# Patient Record
Sex: Female | Born: 1995 | Race: White | Hispanic: No | Marital: Married | Smoking: Never smoker
Health system: Southern US, Community
[De-identification: ages and names within clinical notes are randomized; demographics above are authoritative.]

## PROBLEM LIST (undated history)

## (undated) DIAGNOSIS — N200 Calculus of kidney: Secondary | ICD-10-CM

## (undated) DIAGNOSIS — D696 Thrombocytopenia, unspecified: Secondary | ICD-10-CM

## (undated) DIAGNOSIS — O009 Unspecified ectopic pregnancy without intrauterine pregnancy: Secondary | ICD-10-CM

## (undated) DIAGNOSIS — F319 Bipolar disorder, unspecified: Secondary | ICD-10-CM

## (undated) HISTORY — PX: UPPER GASTROINTESTINAL ENDOSCOPY: SHX188

## (undated) HISTORY — PX: TUBAL LIGATION: SHX77

## (undated) HISTORY — PX: LAPAROSCOPIC CHOLECYSTECTOMY: SUR755

## (undated) HISTORY — DX: Unspecified ectopic pregnancy without intrauterine pregnancy: O00.90

## (undated) HISTORY — DX: Calculus of kidney: N20.0

---

## 2019-05-06 ENCOUNTER — Other Ambulatory Visit: Payer: Self-pay

## 2019-05-06 ENCOUNTER — Encounter (HOSPITAL_COMMUNITY): Payer: Self-pay | Admitting: Emergency Medicine

## 2019-05-06 ENCOUNTER — Emergency Department (HOSPITAL_COMMUNITY): Payer: PRIVATE HEALTH INSURANCE

## 2019-05-06 ENCOUNTER — Other Ambulatory Visit: Payer: Self-pay | Admitting: Obstetrics and Gynecology

## 2019-05-06 ENCOUNTER — Observation Stay (HOSPITAL_COMMUNITY)
Admission: EM | Admit: 2019-05-06 | Discharge: 2019-05-07 | Disposition: A | Discharge status: Home / Self Care | Payer: PRIVATE HEALTH INSURANCE | Attending: Family Medicine | Admitting: Family Medicine

## 2019-05-06 ENCOUNTER — Observation Stay (HOSPITAL_COMMUNITY): Payer: PRIVATE HEALTH INSURANCE

## 2019-05-06 DIAGNOSIS — Z3A01 Less than 8 weeks gestation of pregnancy: Secondary | ICD-10-CM | POA: Insufficient documentation

## 2019-05-06 DIAGNOSIS — N8312 Corpus luteum cyst of left ovary: Secondary | ICD-10-CM | POA: Diagnosis not present

## 2019-05-06 DIAGNOSIS — O00101 Right tubal pregnancy without intrauterine pregnancy: Principal | ICD-10-CM | POA: Insufficient documentation

## 2019-05-06 DIAGNOSIS — N888 Other specified noninflammatory disorders of cervix uteri: Secondary | ICD-10-CM | POA: Insufficient documentation

## 2019-05-06 DIAGNOSIS — Z7982 Long term (current) use of aspirin: Secondary | ICD-10-CM | POA: Diagnosis not present

## 2019-05-06 DIAGNOSIS — O26899 Other specified pregnancy related conditions, unspecified trimester: Secondary | ICD-10-CM | POA: Diagnosis present

## 2019-05-06 DIAGNOSIS — R102 Pelvic and perineal pain: Secondary | ICD-10-CM | POA: Diagnosis present

## 2019-05-06 DIAGNOSIS — O3680X0 Pregnancy with inconclusive fetal viability, not applicable or unspecified: Secondary | ICD-10-CM

## 2019-05-06 DIAGNOSIS — D696 Thrombocytopenia, unspecified: Secondary | ICD-10-CM | POA: Insufficient documentation

## 2019-05-06 DIAGNOSIS — Z9049 Acquired absence of other specified parts of digestive tract: Secondary | ICD-10-CM | POA: Diagnosis not present

## 2019-05-06 DIAGNOSIS — R109 Unspecified abdominal pain: Secondary | ICD-10-CM | POA: Diagnosis present

## 2019-05-06 DIAGNOSIS — Z20822 Contact with and (suspected) exposure to covid-19: Secondary | ICD-10-CM | POA: Diagnosis not present

## 2019-05-06 DIAGNOSIS — R110 Nausea: Secondary | ICD-10-CM | POA: Insufficient documentation

## 2019-05-06 DIAGNOSIS — K661 Hemoperitoneum: Secondary | ICD-10-CM | POA: Diagnosis not present

## 2019-05-06 DIAGNOSIS — R9389 Abnormal findings on diagnostic imaging of other specified body structures: Secondary | ICD-10-CM | POA: Diagnosis not present

## 2019-05-06 DIAGNOSIS — O26892 Other specified pregnancy related conditions, second trimester: Secondary | ICD-10-CM | POA: Diagnosis not present

## 2019-05-06 HISTORY — DX: Thrombocytopenia, unspecified: D69.6

## 2019-05-06 LAB — COMPREHENSIVE METABOLIC PANEL
ALT: 16 U/L (ref 0–44)
AST: 16 U/L (ref 15–41)
Albumin: 4.3 g/dL (ref 3.5–5.0)
Alkaline Phosphatase: 45 U/L (ref 38–126)
Anion gap: 10 (ref 5–15)
BUN: 9 mg/dL (ref 6–20)
CO2: 21 mmol/L — ABNORMAL LOW (ref 22–32)
Calcium: 8.6 mg/dL — ABNORMAL LOW (ref 8.9–10.3)
Chloride: 107 mmol/L (ref 98–111)
Creatinine, Ser: 0.58 mg/dL (ref 0.44–1.00)
GFR calc Af Amer: >60 mL/min (ref >60)
GFR calc non Af Amer: >60 mL/min (ref >60)
Glucose, Bld: 94 mg/dL (ref 70–99)
Potassium: 3.6 mmol/L (ref 3.5–5.1)
Sodium: 138 mmol/L (ref 135–145)
Total Bilirubin: 0.5 mg/dL (ref 0.3–1.2)
Total Protein: 7.2 g/dL (ref 6.5–8.1)

## 2019-05-06 LAB — CBC WITH DIFFERENTIAL/PLATELET
Abs Immature Granulocytes: 0.01 10*3/uL (ref 0.00–0.07)
Basophils Absolute: 0.1 10*3/uL (ref 0.0–0.1)
Basophils Relative: 1 %
Eosinophils Absolute: 0.1 10*3/uL (ref 0.0–0.5)
Eosinophils Relative: 1 %
HCT: 40.4 % (ref 36.0–46.0)
Hemoglobin: 13.7 g/dL (ref 12.0–15.0)
Immature Granulocytes: 0 %
Lymphocytes Relative: 28 %
Lymphs Abs: 1.7 10*3/uL (ref 0.7–4.0)
MCH: 30.8 pg (ref 26.0–34.0)
MCHC: 33.9 g/dL (ref 30.0–36.0)
MCV: 90.8 fL (ref 80.0–100.0)
Monocytes Absolute: 0.4 10*3/uL (ref 0.1–1.0)
Monocytes Relative: 6 %
Neutro Abs: 3.8 10*3/uL (ref 1.7–7.7)
Neutrophils Relative %: 64 %
Platelets: 120 10*3/uL — ABNORMAL LOW (ref 150–400)
RBC: 4.45 MIL/uL (ref 3.87–5.11)
RDW: 12.6 % (ref 11.5–15.5)
WBC: 6.1 10*3/uL (ref 4.0–10.5)
nRBC: 0 % (ref 0.0–0.2)

## 2019-05-06 LAB — FIBRINOGEN: Fibrinogen: 309 mg/dL (ref 210–475)

## 2019-05-06 LAB — I-STAT BETA HCG BLOOD, ED (MC, WL, AP ONLY): I-stat hCG, quantitative: >2000 m[IU]/mL — ABNORMAL HIGH (ref <5)

## 2019-05-06 LAB — URINALYSIS, ROUTINE W REFLEX MICROSCOPIC
Bacteria, UA: NONE SEEN
Bilirubin Urine: NEGATIVE
Glucose, UA: NEGATIVE mg/dL
Hgb urine dipstick: NEGATIVE
Ketones, ur: 5 mg/dL — AB
Leukocytes,Ua: NEGATIVE
Nitrite: NEGATIVE
Protein, ur: NEGATIVE mg/dL
Specific Gravity, Urine: 1.008 (ref 1.005–1.030)
pH: 6 (ref 5.0–8.0)

## 2019-05-06 LAB — RESPIRATORY PANEL BY RT PCR (FLU A&B, COVID)
Influenza A by PCR: NEGATIVE
Influenza B by PCR: NEGATIVE
SARS Coronavirus 2 by RT PCR: NEGATIVE

## 2019-05-06 LAB — PROTIME-INR
INR: 1.1 (ref 0.8–1.2)
Prothrombin Time: 13.9 seconds (ref 11.4–15.2)

## 2019-05-06 LAB — HCG, QUANTITATIVE, PREGNANCY: hCG, Beta Chain, Quant, S: 15722 m[IU]/mL — ABNORMAL HIGH (ref <5)

## 2019-05-06 LAB — APTT: aPTT: 30 seconds (ref 24–36)

## 2019-05-06 LAB — LIPASE, BLOOD: Lipase: 29 U/L (ref 11–51)

## 2019-05-06 MED ORDER — ONDANSETRON HCL 4 MG/2ML IJ SOLN
4.0000 mg | Freq: Once | INTRAMUSCULAR | Status: AC
  Administered 2019-05-06: 4 mg via INTRAVENOUS
  Filled 2019-05-06: qty 2

## 2019-05-06 MED ORDER — ZOLPIDEM TARTRATE 5 MG PO TABS
5.0000 mg | ORAL_TABLET | Freq: Every evening | ORAL | Status: DC | PRN
  Administered 2019-05-06: 5 mg via ORAL
  Filled 2019-05-06: qty 1

## 2019-05-06 MED ORDER — SODIUM CHLORIDE 0.9 % IV BOLUS
1000.0000 mL | Freq: Once | INTRAVENOUS | Status: AC
  Administered 2019-05-06: 1000 mL via INTRAVENOUS

## 2019-05-06 MED ORDER — LACTATED RINGERS IV SOLN: INTRAVENOUS | Status: DC

## 2019-05-06 MED ORDER — HYDROMORPHONE HCL 1 MG/ML IJ SOLN
0.5000 mg | INTRAMUSCULAR | Status: AC | PRN
  Administered 2019-05-06 (×2): 0.5 mg via INTRAVENOUS
  Filled 2019-05-06 (×2): qty 1

## 2019-05-06 MED ORDER — MORPHINE SULFATE (PF) 4 MG/ML IV SOLN
4.0000 mg | Freq: Once | INTRAVENOUS | Status: AC
  Administered 2019-05-06: 4 mg via INTRAVENOUS
  Filled 2019-05-06: qty 1

## 2019-05-06 MED ORDER — ONDANSETRON HCL 4 MG PO TABS: 4.0000 mg | ORAL_TABLET | Freq: Four times a day (QID) | ORAL | Status: DC | PRN

## 2019-05-06 MED ORDER — ONDANSETRON HCL 4 MG/2ML IJ SOLN: 4.0000 mg | Freq: Four times a day (QID) | INTRAMUSCULAR | Status: DC | PRN

## 2019-05-06 NOTE — ED Triage Notes (Signed)
Per pt, states lower abdominal pain that started this am-N/D

## 2019-05-06 NOTE — ED Notes (Signed)
[facility] dispatch notified for need of transport.  

## 2019-05-06 NOTE — ED Notes (Signed)
[facility] here for transport to [facility].

## 2019-05-06 NOTE — ED Notes (Signed)
Report attempted to be called to [facility] but was told nurse was still in report and would call back.

## 2019-05-06 NOTE — ED Provider Notes (Signed)
[facility]-EMERGENCY DEPT Provider Note   CSN: [id number] Arrival date & time: 05/06/19  1456     History Chief Complaint  Patient presents with  . Abdominal Pain    Sally Haas is a 24 y.o. female.  HPI      Sally Haas is a 24 y.o. female, with a history of G3P1, presenting to the ED with sudden onset of abdominal pain around 10:40 AM this morning.  Patient was standing stationary at that time.  Pain is right lower quadrant, sharp and stabbing, currently 7/10, radiating toward the suprapubic and periumbilical regions.  She has had a couple episodes of loose stools.  Accompanied by nausea. She states she had some vaginal bleeding last night and has been spotting since then. LMP March 23, 2019.  She notes she had a positive pregnancy test at home. She does not have an assigned OB/GYN. She has had one pregnancy ending in miscarriage and one live birth. Last food was 9:30 AM this morning. Denies fever/chills, vomiting, hematochezia/melena, chest pain, shortness of breath, cough, urinary symptoms, or any other complaints.   History reviewed. No pertinent past medical history.  Patient Active Problem List   Diagnosis Date Noted  . Abdominal pain in pregnancy 05/06/2019  . Pregnancy of unknown anatomic location 05/06/2019    History reviewed. No pertinent surgical history.   OB History   No obstetric history on file.     History reviewed. No pertinent family history.  Social History   Tobacco Use  . Smoking status: Not on file  Substance Use Topics  . Alcohol use: Not on file  . Drug use: Not on file    Home Medications Prior to Admission medications   Medication Sig Start Date End Date Taking? Authorizing Provider  aspirin-acetaminophen-caffeine (EXCEDRIN MIGRAINE) [phone] MG tablet Take 1 tablet by mouth every 6 (six) hours as needed for headache.   Yes [provider]  Multiple Vitamins-Minerals (MULTIVITAMIN GUMMIES  ADULT PO) Take 1 Troche by mouth daily in the afternoon.   Yes [provider]    Allergies    Patient has no known allergies.  Review of Systems   Review of Systems  Constitutional: Negative for chills and fever.  Respiratory: Negative for cough and shortness of breath.   Cardiovascular: Negative for chest pain.  Gastrointestinal: Positive for abdominal pain, diarrhea and nausea. Negative for vomiting.  Genitourinary: Positive for vaginal bleeding. Negative for dysuria and frequency.  Musculoskeletal: Negative for back pain.  Neurological: Negative for dizziness, syncope, weakness and light-headedness.  All other systems reviewed and are negative.   Physical Exam Updated Vital Signs BP 121/74 (BP Location: Right Arm)   Pulse 80   Temp 98.6 F (37 C) (Oral)   Resp 16   Wt 82.6 kg   SpO2 100%   Physical Exam Vitals and nursing note reviewed.  Constitutional:      General: She is in acute distress (Pain).     Appearance: She is well-developed. She is not diaphoretic.  HENT:     Head: Normocephalic and atraumatic.     Mouth/Throat:     Mouth: Mucous membranes are moist.     Pharynx: Oropharynx is clear.  Eyes:     Conjunctiva/sclera: Conjunctivae normal.  Cardiovascular:     Rate and Rhythm: Normal rate and regular rhythm.     Pulses: Normal pulses.          Radial pulses are 2+ on the right side and 2+  on the left side.     Heart sounds: Normal heart sounds.  Pulmonary:     Effort: Pulmonary effort is normal. No respiratory distress.     Breath sounds: Normal breath sounds.  Abdominal:     Palpations: Abdomen is soft.     Tenderness: There is abdominal tenderness. There is no guarding.       Comments: Tenderness is worst in the right lower quadrant.  Musculoskeletal:     Cervical back: Neck supple.     Right lower leg: No edema.     Left lower leg: No edema.  Lymphadenopathy:     Cervical: No cervical adenopathy.  Skin:    General: Skin is warm and  dry.  Neurological:     Mental Status: She is alert.  Psychiatric:        Mood and Affect: Mood and affect normal.        Speech: Speech normal.        Behavior: Behavior normal.     ED Results / Procedures / Treatments   Labs (all labs ordered are listed, but only abnormal results are displayed) Labs Reviewed  COMPREHENSIVE METABOLIC PANEL - Abnormal; Notable for the following components:      Result Value   CO2 21 (*)    Calcium 8.6 (*)    All other components within normal limits  URINALYSIS, ROUTINE W REFLEX MICROSCOPIC - Abnormal; Notable for the following components:   Color, Urine STRAW (*)    Ketones, ur 5 (*)    All other components within normal limits  CBC WITH DIFFERENTIAL/PLATELET - Abnormal; Notable for the following components:   Platelets 120 (*)    All other components within normal limits  HCG, QUANTITATIVE, PREGNANCY - Abnormal; Notable for the following components:   hCG, Beta Chain, Quant, S 15,722 (*)    All other components within normal limits  I-STAT BETA HCG BLOOD, ED ([facility], [facility], [facility] ONLY) - Abnormal; Notable for the following components:   I-stat hCG, quantitative >2,000.0 (*)    All other components within normal limits  RESPIRATORY PANEL BY RT PCR (FLU A&B, COVID)  LIPASE, BLOOD  APTT  CBC  FIBRINOGEN  HCG, QUANTITATIVE, PREGNANCY  PROTIME-INR  TYPE AND SCREEN    EKG None  Radiology US OB LESS THAN 14 WEEKS WITH OB TRANSVAGINAL  Result Date: 05/06/2019 CLINICAL DATA:  Pelvic cramping. Pelvic pain, onset this morning. Gestational age [redacted] weeks 2 days based on last menstrual period of 03/23/2019. EXAM: OBSTETRIC <14 WK [place] AND TRANSVAGINAL OB US TECHNIQUE: Both transabdominal and transvaginal ultrasound examinations were performed for complete evaluation of the gestation as well as the maternal uterus, adnexal regions, and pelvic cul-de-sac. Transvaginal technique was performed to assess early pregnancy. COMPARISON:  None this pregnancy. FINDINGS:  Intrauterine gestational sac: Possible small, however abnormally located in the cervix. Yolk sac:  Not Visualized. Embryo:  Not Visualized. Cardiac Activity: Not Visualized. MSD: 4.2 mm   5 w   1 d Subchorionic hemorrhage:  None visualized. Maternal uterus/adnexae: The uterus is anteverted. The endometrium measures 12-13 mm. Small cystic structure located in the cervix may represent a gestational sac, but is abnormally located. The right ovary appears normal measuring 3.4 x 2.1 x 2.1 cm. Blood flow is noted. Small amount of free fluid in the right adnexa and pelvis appears to be simple. The left ovary measures 3.4 x 2.0 x 3.4 cm and contains a hypoechoic 2.6 cm cyst which may represent a collapsing corpus luteum.  Blood flow is noted. No extra ovarian adnexal mass. IMPRESSION: 1. Possible tiny gestational sac abnormally position in the cervix, but no yolk sac, fetal pole, or cardiac activity demonstrated. Findings are suspicious but not yet definitive for failed pregnancy. No other intrauterine gestational sac or intrauterine pregnancy. Recommend trending of beta HCG. Follow-up ultrasound could be considered in 7-10 days based on beta hCG trending. 2. No adnexal mass or findings suspicious for ectopic pregnancy. 3. Small amount of free fluid in the right adnexa and dependent pelvis is nonspecific. Right ovary is well visualized and appears normal. Probable collapsing corpus luteal cyst in the left ovary. Electronically Signed   By: Keith Rake M.D.   On: 05/06/2019 17:13    Procedures Procedures (including critical care time)  Medications Ordered in ED Medications  lactated ringers infusion (has no administration in time range)  HYDROmorphone (DILAUDID) injection 0.5 mg (has no administration in time range)  ondansetron (ZOFRAN) tablet 4 mg (has no administration in time range)    Or  ondansetron (ZOFRAN) injection 4 mg (has no administration in time range)  zolpidem (AMBIEN) tablet 5 mg (has no  administration in time range)  morphine 4 MG/ML injection 4 mg (4 mg Intravenous Given 05/06/19 1602)  ondansetron (ZOFRAN) injection 4 mg (4 mg Intravenous Given 05/06/19 1602)  sodium chloride 0.9 % bolus 1,000 mL (0 mLs Intravenous Stopped 05/06/19 1922)  morphine 4 MG/ML injection 4 mg (4 mg Intravenous Given 05/06/19 1811)  ondansetron (ZOFRAN) injection 4 mg (4 mg Intravenous Given 05/06/19 1811)    ED Course  I have reviewed the triage vital signs and the nursing notes.  Pertinent labs & imaging results that were available during my care of the patient were reviewed by me and considered in my medical decision making (see chart for details).  Clinical Course as of May 05 2044  Sun May 06, 2019  1634 Patient states her pain has improved to 3/10.   [SJ]  29 Spoke with Dr. Ilda Basset, OBGYN.  We discussed the patient's symptoms in the setting of pregnancy as well as the radiologist's interpretation of the ultrasound. He reviewed the patient's ultrasound as well. He states it is possible that it is a miscarriage, however, he does not see other signs of that on the ultrasound. He has concern for possible cervical ectopic pregnancy. He states patient will need MRI to assess further and he asked for [place] to check to see if MRI personnel were onsite here at [facility]. If not, call back.   [SJ]  1733 Attempted to call MRI. No answer.   [SJ]  50 Call Dr. Ilda Basset back. He recommends admitting patient over at [facility], observation, request [place] (Med-Surg). Requests I place admission bed request orders. Requests 2 hour COVID test.   [SJ]    Clinical Course User Index [SJ] Minetta Krisher, Helane Gunther, PA-C   MDM Rules/Calculators/A&P                      Patient presents with sudden onset of right lower pelvic pain. Patient is nontoxic appearing, afebrile, not tachycardic, not tachypneic, not hypotensive, maintains excellent SPO2 on room air.  I reviewed and interpreted the patient's labs and  radiological studies. Ultrasound without intrauterine pregnancy. Possible pregnancy within the cervix, could be ectopic pregnancy. Transferred to [facility] for further management.   Findings and plan of care discussed with Dorie Rank, MD.   Vitals:   05/06/19 1600 05/06/19 1800 05/06/19 1920 05/06/19  2037  BP: 122/68 124/74 124/74 118/80  Pulse: 73 69 77 65  Resp:   15 18  Temp:   98.7 F (37.1 C) 98.6 F (37 C)  TempSrc:   Oral Oral  SpO2: 98% 100% 100% 98%  Weight:   82.6 kg   Height:   5\' 5"  (1.651 m)      Final Clinical Impression(s) / ED Diagnoses Final diagnoses:  Pelvic pain in pregnancy    Rx / DC Orders ED Discharge Orders    None       05/06/19 2046    2047, MD 05/07/19 1041

## 2019-05-06 NOTE — H&P (Signed)
Obstetrics & Gynecology H&P   Date of Admission: 05/06/2019   Requesting Provider: [facility]  Primary Care Provider: Patient, No Pcp Per  Reason for Admission: ?cx ectopic  History of Present Illness: Ms. Gaffin is a 24 y.o. G3P1011 (LMP: 03/23/19), with the above CC. PMHx is significant for nothing.  Patient with stabbing lower abdominal pain today so came in for evalu. Took home UPT about a week ago and it was  +. Pt only ever with spotting with wiping. Patient with some nausea w/o vomiting. She was seen in [facility] ED and had a quant of 15k, normal H/H and u/s that showed ?cx ectopic. She was transferred to [facility] for an MRI and observation.  Pain currently stable.   ROS: A 12-point review of systems was performed and negative, except as stated in the above HPI.  OBGYN History: As per HPI. OB History  Gravida Para Term Preterm AB Living  3 1 1   1 1   SAB TAB Ectopic Multiple Live Births  1       1    # Outcome Date GA Lbr Len/2nd Weight Sex Delivery Anes PTL Lv  3 Current           2 Term           1 SAB             Obstetric Comments  G1: early SAB  G2: TSVD 2017    Periods: qmonth, regular She is currently using nothing for contraception.    Past Medical History: History reviewed. No pertinent past medical history.  Past Surgical History: Past Surgical History:  Procedure Laterality Date  . LAPAROSCOPIC CHOLECYSTECTOMY      Family History:  History reviewed. No pertinent family history. Social History:  Social History   Socioeconomic History  . Marital status: Married    Spouse name: Not on file  . Number of children: Not on file  . Years of education: Not on file  . Highest education level: Not on file  Occupational History  . Not on file  Tobacco Use  . Smoking status: Not on file  Substance and Sexual Activity  . Alcohol use: Not on file  . Drug use: Not on file  . Sexual activity: Not on file  Other Topics Concern  . Not on file  Social  History Narrative  . Not on file   Social Determinants of Health   Financial Resource Strain:   . Difficulty of Paying Living Expenses:   Food Insecurity:   . Worried About Charity fundraiser in the Last Year:   . Arboriculturist in the Last Year:   Transportation Needs:   . Film/video editor (Medical):   Marland Kitchen Lack of Transportation (Non-Medical):   Physical Activity:   . Days of Exercise per Week:   . Minutes of Exercise per Session:   Stress:   . Feeling of Stress :   Social Connections:   . Frequency of Communication with Friends and Family:   . Frequency of Social Gatherings with Friends and Family:   . Attends Religious Services:   . Active Member of Clubs or Organizations:   . Attends Archivist Meetings:   Marland Kitchen Marital Status:   Intimate Partner Violence:   . Fear of Current or Ex-Partner:   . Emotionally Abused:   Marland Kitchen Physically Abused:   . Sexually Abused:     Allergy: No Known Allergies  Current Outpatient  Medications: Medications Prior to Admission  Medication Sig Dispense Refill Last Dose  . aspirin-acetaminophen-caffeine (EXCEDRIN MIGRAINE) 250-250-65 MG tablet Take 1 tablet by mouth every 6 (six) hours as needed for headache.   05/06/2019 at Unknown time  . Multiple Vitamins-Minerals (MULTIVITAMIN GUMMIES ADULT PO) Take 1 Troche by mouth daily in the afternoon.   Past Week at Unknown time     Hospital Medications: Current Facility-Administered Medications  Medication Dose Route Frequency Provider Last Rate Last Admin  . HYDROmorphone (DILAUDID) injection 0.5 mg  0.5 mg Intravenous Q2H PRN [place] Bing, MD   0.5 mg at 05/06/19 2048  . lactated ringers infusion   Intravenous Continuous [place] Bing, MD 100 mL/hr at 05/06/19 2209 New Bag at 05/06/19 2209  . ondansetron (ZOFRAN) tablet 4 mg  4 mg Oral Q6H PRN [place] Bing, MD       Or  . ondansetron (ZOFRAN) injection 4 mg  4 mg Intravenous Q6H PRN [place] Bing, MD      . zolpidem  (AMBIEN) tablet 5 mg  5 mg Oral QHS PRN Bruce Bing, MD         Physical Exam:   Current Vital Signs 24h Vital Sign Ranges  T 98.9 F (37.2 C) Temp  Avg: 98.7 F (37.1 C)  Min: 98.6 F (37 C)  Max: 98.9 F (37.2 C)  BP 122/66 BP  Min: 118/80  Max: 124/74  HR 72 Pulse  Avg: 72.7  Min: 65  Max: 80  RR 16 Resp  Avg: 16.3  Min: 15  Max: 18  SaO2 100 % Room Air SpO2  Avg: 99.3 %  Min: 98 %  Max: 100 %       24 Hour I/O Current Shift I/O  Time Ins Outs No intake/output data recorded. 04/18 1901 - 04/19 0700 In: 1000  Out: -    Patient Vitals for the past 24 hrs:  BP Temp Temp src Pulse Resp SpO2 Height Weight  05/06/19 2217 122/66 98.9 F (37.2 C) Oral 72 16 100 % -- --  05/06/19 2037 118/80 98.6 F (37 C) Oral 65 18 98 % -- --  05/06/19 1920 124/74 98.7 F (37.1 C) Oral 77 15 100 % 5\' 5"  (1.651 m) 82.6 kg  05/06/19 1800 124/74 -- -- 69 -- 100 % -- --  05/06/19 1600 122/68 -- -- 73 -- 98 % -- --  05/06/19 1507 121/74 98.6 F (37 C) Oral 80 16 100 % -- 82.6 kg    Body mass index is 30.29 kg/m. General appearance: Well nourished, well developed female in no acute distress.  Cardiovascular: S1, S2 normal, no murmur, rub or gallop, regular rate and rhythm Respiratory:  Clear to auscultation bilateral. Normal respiratory effort Abdomen: non distended, mildly ttp in lower belly mid part and slightly to the right, no peritoneal s/s Neuro/Psych:  Normal mood and affect.  Skin:  Warm and dry.  Extremities: no clubbing, cyanosis, or edema.  Lymphatic:  No inguinal lymphadenopathy.    Laboratory: Results for NORVELL, URESTE (MRN Selinda Michaels) as of 05/06/2019 22:58  Ref. Range 05/06/2019 15:46  HCG, Beta Chain, Quant, S Latest Ref Range: <5 mIU/mL 15,722 (H)   Recent Labs  Lab 05/06/19 1531  WBC 6.1  HGB 13.7  HCT 40.4  PLT 120*   Recent Labs  Lab 05/06/19 1531  NA 138  K 3.6  CL 107  CO2 21*  BUN 9  CREATININE 0.58  CALCIUM 8.6*  PROT 7.2  BILITOT 0.5  ALKPHOS 45  ALT 16  AST 16  GLUCOSE 94   Negative: COVID, U/A, lipase  Pending: type and screen, cbc, coags and fibrinogen  Imaging:  CLINICAL DATA:  Pelvic cramping. Pelvic pain, onset this morning. Gestational age [redacted] weeks 2 days based on last menstrual period of 03/23/2019.  EXAM: OBSTETRIC <14 WK [place] AND TRANSVAGINAL OB US  TECHNIQUE: Both transabdominal and transvaginal ultrasound examinations were performed for complete evaluation of the gestation as well as the maternal uterus, adnexal regions, and pelvic cul-de-sac. Transvaginal technique was performed to assess early pregnancy.  COMPARISON:  None this pregnancy.  FINDINGS: Intrauterine gestational sac: Possible small, however abnormally located in the cervix.  Yolk sac:  Not Visualized.  Embryo:  Not Visualized.  Cardiac Activity: Not Visualized.  MSD: 4.2 mm   5 w   1 d  Subchorionic hemorrhage:  None visualized.  Maternal uterus/adnexae: The uterus is anteverted. The endometrium measures 12-13 mm. Small cystic structure located in the cervix may represent a gestational sac, but is abnormally located. The right ovary appears normal measuring 3.4 x 2.1 x 2.1 cm. Blood flow is noted. Small amount of free fluid in the right adnexa and pelvis appears to be simple. The left ovary measures 3.4 x 2.0 x 3.4 cm and contains a hypoechoic 2.6 cm cyst which may represent a collapsing corpus luteum. Blood flow is noted. No extra ovarian adnexal mass.  IMPRESSION: 1. Possible tiny gestational sac abnormally position in the cervix, but no yolk sac, fetal pole, or cardiac activity demonstrated. Findings are suspicious but not yet definitive for failed pregnancy. No other intrauterine gestational sac or intrauterine pregnancy. Recommend trending of beta HCG. Follow-up ultrasound could be considered in 7-10 days based on beta hCG trending. 2. No adnexal mass or findings suspicious for ectopic pregnancy. 3.  Small amount of free fluid in the right adnexa and dependent pelvis is nonspecific. Right ovary is well visualized and appears normal. Probable collapsing corpus luteal cyst in the left ovary.   Electronically Signed   By: Narda Rutherford M.D.   On: 05/06/2019 17:13    MRI final read pending for tomorrow   Assessment: Ms. Kohlman is a 24 y.o. G3P1011 with concern for cervical ectopic pregnancy. Pt stable  Plan: Long d/w o/n radiologist and MRI not definitive for ectopic pregnancy but does have some concerning features; will have MRI body radiologist review for tomorrow I d/w the patient and her husband re: uncertainty of dx and will follow up with radiology tomorrow and likely d/w REI, as well.   Plan for tonight is NPO, MIVF and to let us know any change in s/s.   Total time taking care of the patient was 30 minutes, with greater than 50% of the time spent in face to face interaction with the patient.  Cornelia Copa MD Attending Center for [facility] (Faculty Practice) GYN Consult Phone: [phone] (M-F, [phone]) & [phone] (Off hours, weekends, holidays)

## 2019-05-07 ENCOUNTER — Encounter: Payer: Self-pay | Admitting: Obstetrics and Gynecology

## 2019-05-07 ENCOUNTER — Encounter (HOSPITAL_COMMUNITY)
Admission: EM | Disposition: A | Discharge status: Home / Self Care | Payer: Self-pay | Source: Home / Self Care | Attending: Emergency Medicine

## 2019-05-07 ENCOUNTER — Encounter (HOSPITAL_COMMUNITY): Payer: Self-pay | Admitting: Obstetrics and Gynecology

## 2019-05-07 ENCOUNTER — Observation Stay (HOSPITAL_COMMUNITY): Payer: PRIVATE HEALTH INSURANCE | Admitting: Anesthesiology

## 2019-05-07 DIAGNOSIS — Z6791 Unspecified blood type, Rh negative: Secondary | ICD-10-CM | POA: Insufficient documentation

## 2019-05-07 DIAGNOSIS — O00101 Right tubal pregnancy without intrauterine pregnancy: Secondary | ICD-10-CM

## 2019-05-07 DIAGNOSIS — O3680X0 Pregnancy with inconclusive fetal viability, not applicable or unspecified: Secondary | ICD-10-CM | POA: Diagnosis not present

## 2019-05-07 DIAGNOSIS — K661 Hemoperitoneum: Secondary | ICD-10-CM | POA: Diagnosis not present

## 2019-05-07 DIAGNOSIS — O26899 Other specified pregnancy related conditions, unspecified trimester: Secondary | ICD-10-CM | POA: Insufficient documentation

## 2019-05-07 HISTORY — PX: DIAGNOSTIC LAPAROSCOPY WITH REMOVAL OF ECTOPIC PREGNANCY: SHX6449

## 2019-05-07 LAB — CBC
HCT: 38.3 % (ref 36.0–46.0)
Hemoglobin: 13 g/dL (ref 12.0–15.0)
MCH: 30.4 pg (ref 26.0–34.0)
MCHC: 33.9 g/dL (ref 30.0–36.0)
MCV: 89.5 fL (ref 80.0–100.0)
Platelets: 107 10*3/uL — ABNORMAL LOW (ref 150–400)
RBC: 4.28 MIL/uL (ref 3.87–5.11)
RDW: 12.8 % (ref 11.5–15.5)
WBC: 4.8 10*3/uL (ref 4.0–10.5)
nRBC: 0 % (ref 0.0–0.2)

## 2019-05-07 LAB — TYPE AND SCREEN
ABO/RH(D): O NEG
Antibody Screen: NEGATIVE

## 2019-05-07 LAB — ABO/RH: ABO/RH(D): O NEG

## 2019-05-07 LAB — HCG, QUANTITATIVE, PREGNANCY: hCG, Beta Chain, Quant, S: 13028 m[IU]/mL — ABNORMAL HIGH (ref <5)

## 2019-05-07 SURGERY — LAPAROSCOPY, WITH ECTOPIC PREGNANCY SURGICAL TREATMENT
Anesthesia: General | Site: Abdomen | Laterality: Right

## 2019-05-07 MED ORDER — FENTANYL CITRATE (PF) 100 MCG/2ML IJ SOLN
25.0000 ug | INTRAMUSCULAR | Status: DC | PRN
  Administered 2019-05-07: 50 ug via INTRAVENOUS

## 2019-05-07 MED ORDER — ROCURONIUM BROMIDE 10 MG/ML (PF) SYRINGE
PREFILLED_SYRINGE | INTRAVENOUS | Status: AC
  Filled 2019-05-07: qty 10

## 2019-05-07 MED ORDER — FENTANYL CITRATE (PF) 100 MCG/2ML IJ SOLN
INTRAMUSCULAR | Status: AC
  Administered 2019-05-07: 50 ug via INTRAVENOUS
  Filled 2019-05-07: qty 2

## 2019-05-07 MED ORDER — BUPIVACAINE HCL (PF) 0.25 % IJ SOLN
INTRAMUSCULAR | Status: AC
  Filled 2019-05-07: qty 30

## 2019-05-07 MED ORDER — SODIUM CHLORIDE 0.9 % IR SOLN
Status: DC | PRN
  Administered 2019-05-07: 1000 mL

## 2019-05-07 MED ORDER — FENTANYL CITRATE (PF) 250 MCG/5ML IJ SOLN
INTRAMUSCULAR | Status: AC
  Filled 2019-05-07: qty 5

## 2019-05-07 MED ORDER — SUGAMMADEX SODIUM 200 MG/2ML IV SOLN
INTRAVENOUS | Status: DC | PRN
  Administered 2019-05-07: 180 mg via INTRAVENOUS

## 2019-05-07 MED ORDER — RHO D IMMUNE GLOBULIN 1500 UNIT/2ML IJ SOSY
300.0000 ug | PREFILLED_SYRINGE | Freq: Once | INTRAMUSCULAR | Status: AC
  Administered 2019-05-07: 300 ug via INTRAMUSCULAR
  Filled 2019-05-07: qty 2

## 2019-05-07 MED ORDER — DEXAMETHASONE SODIUM PHOSPHATE 10 MG/ML IJ SOLN
INTRAMUSCULAR | Status: AC
  Filled 2019-05-07: qty 1

## 2019-05-07 MED ORDER — ROCURONIUM BROMIDE 10 MG/ML (PF) SYRINGE
PREFILLED_SYRINGE | INTRAVENOUS | Status: DC | PRN
  Administered 2019-05-07: 70 mg via INTRAVENOUS

## 2019-05-07 MED ORDER — LACTATED RINGERS IV SOLN: INTRAVENOUS | Status: DC

## 2019-05-07 MED ORDER — PROPOFOL 10 MG/ML IV BOLUS
INTRAVENOUS | Status: DC | PRN
  Administered 2019-05-07: 150 mg via INTRAVENOUS

## 2019-05-07 MED ORDER — MIDAZOLAM HCL 2 MG/2ML IJ SOLN
INTRAMUSCULAR | Status: AC
  Filled 2019-05-07: qty 2

## 2019-05-07 MED ORDER — PROPOFOL 10 MG/ML IV BOLUS
INTRAVENOUS | Status: AC
  Filled 2019-05-07: qty 20

## 2019-05-07 MED ORDER — ONDANSETRON HCL 4 MG/2ML IJ SOLN
INTRAMUSCULAR | Status: DC | PRN
  Administered 2019-05-07: 4 mg via INTRAVENOUS

## 2019-05-07 MED ORDER — 0.9 % SODIUM CHLORIDE (POUR BTL) OPTIME
TOPICAL | Status: DC | PRN
  Administered 2019-05-07: 1000 mL

## 2019-05-07 MED ORDER — LIDOCAINE 2% (20 MG/ML) 5 ML SYRINGE
INTRAMUSCULAR | Status: AC
  Filled 2019-05-07: qty 5

## 2019-05-07 MED ORDER — LIDOCAINE 2% (20 MG/ML) 5 ML SYRINGE
INTRAMUSCULAR | Status: DC | PRN
  Administered 2019-05-07: 40 mg via INTRAVENOUS

## 2019-05-07 MED ORDER — BUPIVACAINE HCL (PF) 0.25 % IJ SOLN
INTRAMUSCULAR | Status: DC | PRN
  Administered 2019-05-07: 16 mL

## 2019-05-07 MED ORDER — PHENYLEPHRINE 40 MCG/ML (10ML) SYRINGE FOR IV PUSH (FOR BLOOD PRESSURE SUPPORT)
PREFILLED_SYRINGE | INTRAVENOUS | Status: DC | PRN
  Administered 2019-05-07 (×2): 80 ug via INTRAVENOUS

## 2019-05-07 MED ORDER — ACETAMINOPHEN 500 MG PO TABS
1000.0000 mg | ORAL_TABLET | Freq: Once | ORAL | Status: AC
  Administered 2019-05-07: 1000 mg via ORAL
  Filled 2019-05-07: qty 2

## 2019-05-07 MED ORDER — MIDAZOLAM HCL 2 MG/2ML IJ SOLN
INTRAMUSCULAR | Status: DC | PRN
  Administered 2019-05-07: 2 mg via INTRAVENOUS

## 2019-05-07 MED ORDER — ONDANSETRON HCL 4 MG/2ML IJ SOLN
INTRAMUSCULAR | Status: AC
  Filled 2019-05-07: qty 2

## 2019-05-07 MED ORDER — HYDROMORPHONE HCL 1 MG/ML IJ SOLN
1.0000 mg | INTRAMUSCULAR | Status: AC | PRN
  Administered 2019-05-07 (×2): 1 mg via INTRAVENOUS
  Filled 2019-05-07 (×2): qty 1

## 2019-05-07 MED ORDER — DEXAMETHASONE SODIUM PHOSPHATE 10 MG/ML IJ SOLN
INTRAMUSCULAR | Status: DC | PRN
  Administered 2019-05-07: 10 mg via INTRAVENOUS

## 2019-05-07 MED ORDER — FENTANYL CITRATE (PF) 250 MCG/5ML IJ SOLN
INTRAMUSCULAR | Status: DC | PRN
  Administered 2019-05-07: 50 ug via INTRAVENOUS

## 2019-05-07 MED ORDER — OXYCODONE HCL 5 MG PO TABS: 5.0000 mg | ORAL_TABLET | Freq: Four times a day (QID) | ORAL | 0 refills | Status: DC | PRN

## 2019-05-07 SURGICAL SUPPLY — 29 items
BLADE SURG 15 STRL LF DISP TIS (BLADE) ×2 IMPLANT
BLADE SURG 15 STRL SS (BLADE) ×2
DERMABOND ADVANCED (GAUZE/BANDAGES/DRESSINGS) ×2
DERMABOND ADVANCED .7 DNX12 (GAUZE/BANDAGES/DRESSINGS) ×2 IMPLANT
DRSG OPSITE POSTOP 3X4 (GAUZE/BANDAGES/DRESSINGS) IMPLANT
DURAPREP 26ML APPLICATOR (WOUND CARE) ×4 IMPLANT
GLOVE BIOGEL PI IND STRL 7.0 (GLOVE) ×8 IMPLANT
GLOVE BIOGEL PI INDICATOR 7.0 (GLOVE) ×8
GLOVE ECLIPSE 7.0 STRL STRAW (GLOVE) ×4 IMPLANT
GOWN STRL REUS W/ TWL LRG LVL3 (GOWN DISPOSABLE) ×6 IMPLANT
GOWN STRL REUS W/TWL LRG LVL3 (GOWN DISPOSABLE) ×6
KIT TURNOVER KIT B (KITS) ×4 IMPLANT
PACK LAPAROSCOPY BASIN (CUSTOM PROCEDURE TRAY) ×4 IMPLANT
PACK TRENDGUARD 450 HYBRID PRO (MISCELLANEOUS) ×2 IMPLANT
POUCH SPECIMEN RETRIEVAL 10MM (ENDOMECHANICALS) ×4 IMPLANT
PROTECTOR NERVE ULNAR (MISCELLANEOUS) ×8 IMPLANT
SET IRRIG TUBING LAPAROSCOPIC (IRRIGATION / IRRIGATOR) ×4 IMPLANT
SET TUBE SMOKE EVAC HIGH FLOW (TUBING) ×4 IMPLANT
SHEARS HARMONIC ACE PLUS 36CM (ENDOMECHANICALS) ×4 IMPLANT
SLEEVE ENDOPATH XCEL 5M (ENDOMECHANICALS) ×4 IMPLANT
SUT VIC AB 3-0 X1 27 (SUTURE) ×4 IMPLANT
SUT VICRYL 0 UR6 27IN ABS (SUTURE) ×8 IMPLANT
TOWEL GREEN STERILE FF (TOWEL DISPOSABLE) ×8 IMPLANT
TRAY FOLEY W/BAG SLVR 14FR (SET/KITS/TRAYS/PACK) ×4 IMPLANT
TRENDGUARD 450 HYBRID PRO PACK (MISCELLANEOUS) ×4
TROCAR BALLN 12MMX100 BLUNT (TROCAR) ×4 IMPLANT
TROCAR XCEL NON-BLD 11X100MML (ENDOMECHANICALS) IMPLANT
TROCAR XCEL NON-BLD 5MMX100MML (ENDOMECHANICALS) ×4 IMPLANT
WARMER LAPAROSCOPE (MISCELLANEOUS) ×4 IMPLANT

## 2019-05-07 NOTE — Anesthesia Procedure Notes (Signed)
Procedure Name: Intubation Date/Time: 05/07/2019 2:26 PM Performed by: Bryson Corona, CRNA Pre-anesthesia Checklist: Patient identified, Emergency Drugs available, Suction available and Patient being monitored Patient Re-evaluated:Patient Re-evaluated prior to induction Oxygen Delivery Method: Circle System Utilized Preoxygenation: Pre-oxygenation with 100% oxygen Induction Type: IV induction Ventilation: Mask ventilation without difficulty Laryngoscope Size: Mac and 3 Grade View: Grade I Tube type: Oral Tube size: 7.0 mm Number of attempts: 1 Airway Equipment and Method: Stylet Placement Confirmation: ETT inserted through vocal cords under direct vision,  positive ETCO2 and breath sounds checked- equal and bilateral Secured at: 22 cm Tube secured with: Tape Dental Injury: Teeth and Oropharynx as per pre-operative assessment

## 2019-05-07 NOTE — Discharge Instructions (Signed)
Laparoscopy, Care After This sheet gives you information about how to care for yourself after your procedure. Your health care provider may also give you more specific instructions. If you have problems or questions, contact your health care provider. What can I expect after the procedure? After the procedure, it is common to have:  Mild discomfort in the abdomen.  Sore throat. Women who have laparoscopy with pelvic examination may have mild cramping and fluid coming from the vagina for a few days after the procedure. Follow these instructions at home: Medicines  Take over-the-counter and prescription medicines only as told by your health care provider.  If you were prescribed an antibiotic medicine, take it as told by your health care provider. Do not stop taking the antibiotic even if you start to feel better. Driving  Do not drive for 24 hours if you were given a medicine to help you relax (sedative) during your procedure.  Do not drive or use heavy machinery while taking prescription pain medicine. Bathing  Do not take baths, swim, or use a hot tub until your health care provider approves. You may take showers. Incision care   Follow instructions from your health care provider about how to take care of your incisions. Make sure you: ? Wash your hands with soap and water before you change your bandage (dressing). If soap and water are not available, use hand sanitizer. ? Change your dressing as told by your health care provider. ? Leave stitches (sutures), skin glue, or adhesive strips in place. These skin closures may need to stay in place for 2 weeks or longer. If adhesive strip edges start to loosen and curl up, you may trim the loose edges. Do not remove adhesive strips completely unless your health care provider tells you to do that.  Check your incision areas every day for signs of infection. Check for: ? Redness, swelling, or pain. ? Fluid or blood. ? Warmth. ? Pus or a  bad smell. Activity  Return to your normal activities as told by your health care provider. Ask your health care provider what activities are safe for you.  Do not lift anything that is heavier than 10 lb (4.5 kg), or the limit that you are told, until your health care provider says that it is safe. General instructions  To prevent or treat constipation while you are taking prescription pain medicine, your health care provider may recommend that you: ? Drink enough fluid to keep your urine pale yellow. ? Take over-the-counter or prescription medicines. ? Eat foods that are high in fiber, such as fresh fruits and vegetables, whole grains, and beans. ? Limit foods that are high in fat and processed sugars, such as fried and sweet foods.  Do not use any products that contain nicotine or tobacco, such as cigarettes and e-cigarettes. If you need help quitting, ask your health care provider.  Keep all follow-up visits as told by your health care provider. This is important. Contact a health care provider if:  You develop shoulder pain.  You feel lightheaded or faint.  You are unable to pass gas or have a bowel movement.  You feel nauseous or you vomit.  You develop a rash.  You have redness, swelling, or pain around any incision.  You have fluid or blood coming from any incision.  Any incision feels warm to the touch.  You have pus or a bad smell coming from any incision.  You have a fever or chills. Get help right   away if:  You have severe pain.  You have vomiting that does not go away.  You have heavy bleeding from the vagina.  Any incision opens.  You have trouble breathing.  You have chest pain. Summary  After the procedure, it is common to have mild discomfort in the abdomen and a sore throat.  Check your incision areas every day for signs of infection.  Return to your normal activities as told by your health care provider. Ask your health care provider what  activities are safe for you. This information is not intended to replace advice given to you by your health care provider. Make sure you discuss any questions you have with your health care provider. Document Revised: 12/17/2016 Document Reviewed: 06/30/2016 Elsevier Patient Education  2020 [employer]  

## 2019-05-07 NOTE — Progress Notes (Addendum)
Gynecology Progress Note  Admission Date: 05/06/2019 Current Date: 05/07/2019 7:17 AM  Lucero M. Fong is a 24 y.o. [id number] HD#2 admitted for pregnancy of unknown location   History complicated by: Patient Active Problem List   Diagnosis Date Noted  . Rh negative state in antepartum period 05/07/2019  . Abdominal pain in pregnancy 05/06/2019  . Pregnancy of unknown anatomic location 05/06/2019  . Thrombocytopenia (HCC) 05/06/2019    ROS and patient/family/surgical history, located on admission H&P note dated 05/06/2019, have been reviewed, and there are no changes except as noted below Yesterday/Overnight Events:  none  Subjective:  Pt able to get some sleep last night Pain stable  Objective:    Current Vital Signs 24h Vital Sign Ranges  T 98.1 F (36.7 C) Temp  Avg: 98.5 F (36.9 C)  Min: 98.1 F (36.7 C)  Max: 98.9 F (37.2 C)  BP 96/60 BP  Min: 96/60  Max: 124/74  HR 70 Pulse  Avg: 70.9  Min: 61  Max: 80  RR 17 Resp  Avg: 16.3  Min: 15  Max: 18  SaO2 100 % Room Air SpO2  Avg: 99.5 %  Min: 98 %  Max: 100 %       24 Hour I/O Current Shift I/O  Time Ins Outs 04/18 0701 - 04/19 0700 In: 1000  Out: -  No intake/output data recorded.   Physical exam: General appearance: alert, cooperative and appears stated age Abdomen: soft, nd. Minimally ttp in mid to rrlq, no peritoneal s/s Lungs: clear to auscultation bilaterally Heart: S1, S2 normal, no murmur, rub or gallop, regular rate and rhythm Skin: warm and dry Psych: appropriate Neurologic: Grossly normal  Medications Current Facility-Administered Medications  Medication Dose Route Frequency Provider Last Rate Last Admin  . lactated ringers infusion   Intravenous Continuous Berne Bing, MD 100 mL/hr at 05/06/19 2209 New Bag at 05/06/19 2209  . ondansetron (ZOFRAN) tablet 4 mg  4 mg Oral Q6H PRN Council Bing, MD       Or  . ondansetron (ZOFRAN) injection 4 mg  4 mg Intravenous Q6H PRN Amity Bing, MD       . zolpidem (AMBIEN) tablet 5 mg  5 mg Oral QHS PRN [place] Bing, MD   5 mg at 05/06/19 2358      Labs  Recent Labs  Lab 05/06/19 1531 05/06/19 2324  WBC 6.1 4.8  HGB 13.7 13.0  HCT 40.4 38.3  PLT 120* 107*    Recent Labs  Lab 05/06/19 1531  NA 138  K 3.6  CL 107  CO2 21*  BUN 9  CREATININE 0.58  CALCIUM 8.6*  PROT 7.2  BILITOT 0.5  ALKPHOS 45  ALT 16  AST 16  GLUCOSE 94    Radiology I just received a call from radiology and right tubal ectopic is seenon MRI  CLINICAL DATA:  Follow-up abnormal ultrasound.  EXAM: MRI PELVIS WITHOUT CONTRAST  TECHNIQUE: Multiplanar multisequence MR imaging of the pelvis was performed. No intravenous contrast was administered.  COMPARISON:  Ultrasound, same date.  FINDINGS: The endometrium is slightly thickened but no gestational sac is identified. Area of slight decreased T2 signal intensity in the posterior myometrium could be a fibroid or scarring changes. Nabothian cysts are noted at the cervix. On the left side there is an area of slightly larger T2 signal in abnormality which could be a nabothian cyst. I do not see an obvious cervical gestational sac.  The ovaries are normal except for a  slightly collapsed left-sided corpus luteum cyst. Cystic appearing right adnexal structure with surrounding thick edematous appearing wall highly suspicious for right tubal ectopic pregnancy.  IMPRESSION: MR findings highly suspicious for right tubal ectopic pregnancy.  Thickened endometrium but no intrauterine gestational sac.  Normal ovaries.  These results were called by telephone at the time of interpretation on 05/07/2019 at 7:04 am to provider [facility] , who verbally acknowledged these results.   Electronically Signed   By: Marijo Sanes M.D.   On: 05/07/2019 07:07  Assessment & Plan:  Pt stable now with right tubal ectopic *GYN: options d/w her and I told her that I recommend surgery given  the beta hcg level and she is amenable to this. Patient posted for 1500 today with the OR for laparoscopic right salpingectomy. I d/w her that my partner Dr. Kennon Rounds would be doing the surgery. *Heme: plts at her normal.  *Rh neg: rhogam post op  Code Status: Full Code  Total time taking care of the patient was 15 minutes, with greater than 50% of the time spent in face to face interaction with the patient.  Durene Romans MD Attending Center for [facility])

## 2019-05-07 NOTE — Op Note (Signed)
PREOPERATIVE DIAGNOSIS: Probable ruptured ectopic pregnancy  POSTOPERATIVE DIAGNOSIS: Same  PROCEDURE: Laparoscopic right salpingectomy   INDICATIONS: 24 y.o. G3P1011 at Unknown here for with ruptured ectopic pregnancy with blood type O neg. Patient was counseled regarding need for laparoscopic salpingectomy. Risks of surgery including bleeding which may require transfusion or reoperation, infection, injury to bowel or other surrounding organs, need for additional procedures including laparotomy and other postoperative/anesthesia complications were explained to patient.  Written informed consent was obtained  FINDINGS: small amount of hemoperitoneum estimated to be about 50 cc of blood and clots.  Dilated right fallopian tube containing ectopic gestation. Small normal appearing uterus, normal right fallopian tube, right ovary and left ovary.  ANESTHESIA: General  SPECIMENS: right fallopian tube to pathology  COMPLICATIONS: None immediate  PROCEDURE IN DETAIL:  The patient was taken to the operating room where general anesthesia was administered and was found to be adequate.  She was placed in the dorsal lithotomy position, and was prepped and draped in a sterile manner.  A Foley catheter was inserted into her bladder and attached to constant drainage and a uterine manipulator was then advanced into the uterus .  After an adequate timeout was performed, attention was then turned to the patient's abdomen where a 10-mm skin incision was made in the umbilical fold. Fascia and peritoneum were entered sharply.  A 0 Vicryl suture was used to tag the fascia circumferentially.  A Hassan trocar was placed. The laparoscope was introduced.  A survey of the patient's pelvis and abdomen revealed the findings as above.  Two left lower quadrant ports were placed under direct visualization; 5-mm x 2.  Attention was then turned to the right fallopian tube which was grasped and ligated from the underlying mesosalpinx  and uterine attachment using the Harmonic instrument.  Good hemostasis was noted.  The specimen was placed in an EndoCatch bag and removed from the abdomen intact. Clot and blood removed with the Nezjhat. The abdomen was desufflated, and all instruments were removed.  The umbilicus incision was closed with the afore mentioned Vicryl suture; and all skin incisions were closed with a 3-0 Vicryl subcuticular stitch followed by DermaBond. The patient tolerated the procedure well.  All instruments, needles, and sponge counts were correct x 2. The patient was taken to the recovery room in stable condition.   Reva Bores MD 05/07/2019 3:16 PM

## 2019-05-07 NOTE — Anesthesia Postprocedure Evaluation (Signed)
Anesthesia Post Note  Patient: Sally Haas. Gasiorowski  Procedure(s) Performed: Laparoscopic Removal Of Ectopic Pregnancy (Right Abdomen)     Patient location during evaluation: PACU Anesthesia Type: General Level of consciousness: awake Pain management: pain level controlled Vital Signs Assessment: post-procedure vital signs reviewed and stable Respiratory status: spontaneous breathing, nonlabored ventilation, respiratory function stable and patient connected to nasal cannula oxygen Cardiovascular status: blood pressure returned to baseline and stable Postop Assessment: no apparent nausea or vomiting Anesthetic complications: no    Last Vitals:  Vitals:   05/07/19 1630 05/07/19 1710  BP: 108/67 114/74  Pulse: 73 75  Resp: 11 14  Temp:  37 C  SpO2: 97% 100%    Last Pain:  Vitals:   05/07/19 1710  TempSrc: Oral  PainSc:                  Saron Tweed P Jorey Dollard

## 2019-05-07 NOTE — Anesthesia Preprocedure Evaluation (Addendum)
Anesthesia Evaluation  Patient identified by MRN, date of birth, ID band Patient awake    Reviewed: Allergy & Precautions, NPO status , Patient's Chart, lab work & pertinent test results  Airway Mallampati: III  TM Distance: >3 FB Neck ROM: Full    Dental no notable dental hx. (+) Chipped, Dental Advisory Given,    Pulmonary neg pulmonary ROS,    Pulmonary exam normal breath sounds clear to auscultation       Cardiovascular negative cardio ROS Normal cardiovascular exam Rhythm:Regular Rate:Normal     Neuro/Psych negative neurological ROS  negative psych ROS   GI/Hepatic negative GI ROS, Neg liver ROS,   Endo/Other  negative endocrine ROS  Renal/GU negative Renal ROS  negative genitourinary   Musculoskeletal negative musculoskeletal ROS (+)   Abdominal   Peds  Hematology negative hematology ROS (+) Thrombocytopenia plt 107   Anesthesia Other Findings Right ectopic pregnancy  Reproductive/Obstetrics                           Anesthesia Physical Anesthesia Plan  ASA: II  Anesthesia Plan: General   Post-op Pain Management:    Induction: Intravenous  PONV Risk Score and Plan: 3 and Midazolam, Dexamethasone and Ondansetron  Airway Management Planned: Oral ETT  Additional Equipment:   Intra-op Plan:   Post-operative Plan: Extubation in OR  Informed Consent: I have reviewed the patients History and Physical, chart, labs and discussed the procedure including the risks, benefits and alternatives for the proposed anesthesia with the patient or authorized representative who has indicated his/her understanding and acceptance.     Dental advisory given  Plan Discussed with: CRNA  Anesthesia Plan Comments:         Anesthesia Quick Evaluation

## 2019-05-07 NOTE — Progress Notes (Signed)
Patient discharged to home with instructions. 

## 2019-05-07 NOTE — Transfer of Care (Signed)
Immediate Anesthesia Transfer of Care Note  Patient: Sally Haas. Borror  Procedure(s) Performed: Laparoscopic Removal Of Ectopic Pregnancy (Right Abdomen)  Patient Location: PACU  Anesthesia Type:General  Level of Consciousness: drowsy  Airway & Oxygen Therapy: Patient Spontanous Breathing and Patient connected to face mask oxygen  Post-op Assessment: Report given to RN and Post -op Vital signs reviewed and stable  Post vital signs: Reviewed and stable  Last Vitals:  Vitals Value Taken Time  BP 115/75 05/07/19 1529  Temp    Pulse 68 05/07/19 1532  Resp 14 05/07/19 1532  SpO2 98 % 05/07/19 1532  Vitals shown include unvalidated device data.  Last Pain:  Vitals:   05/07/19 0822  TempSrc:   PainSc: 7       Patients Stated Pain Goal: 1 (05/07/19 6073)  Complications: No apparent anesthesia complications

## 2019-05-08 LAB — RHOGAM INJECTION: Unit division: 0

## 2019-05-08 NOTE — Discharge Summary (Signed)
Physician Discharge Summary  Patient ID: Sally Haas MRN: [id number] DOB/AGE: 26-Jan-1995 24 y.o.  Admit date: 05/06/2019 Discharge date: 05/07/2019  Admission Diagnoses:  Active Problems:   Abdominal pain in pregnancy   Pregnancy of unknown anatomic location   Thrombocytopenia ([place])   Hemoperitoneum due to rupture of right tubal ectopic pregnancy   Discharge Diagnoses:  Same  Past Medical History:  Diagnosis Date  . Thrombocytopenia ([place])     Surgeries: Procedure(s): Laparoscopic Removal Of Ectopic Pregnancy on 05/07/2019   Consultants: None  Discharged Condition: Improved  Hospital Course: Sally Haas is an 24 y.o. female G30P1011 who was admitted 05/06/2019 with a chief complaint of  Chief Complaint  Patient presents with  . Abdominal Pain  , and found to have a diagnosis of ectopic pregnancy They were brought to the operating room on 05/07/2019 and underwent the above named procedures.    She was given sequential compression devices, early ambulation and was discharged following   She benefited maximally from their hospital stay and there were no complications. She was ambulating, voiding, tolerating po and deemed stable for discharge.  Recent vital signs:  Vitals:   05/07/19 1630 05/07/19 1710  BP: 108/67 114/74  Pulse: 73 75  Resp: 11 14  Temp:  98.6 F (37 C)  SpO2: 97% 100%   Discharge exam: Physical Examination: General appearance - alert, well appearing, and in no distress Chest - normal effort Heart - normal rate and regular rhythm Abdomen - soft, appropriately tender, dressing is clean and dry Extremities - peripheral pulses normal, no pedal edema, no clubbing or cyanosis, Homan's sign negative bilaterally Recent laboratory studies:  Results for orders placed or performed during the hospital encounter of 05/06/19  Respiratory Panel by RT PCR (Flu A&B, Covid) - Nasopharyngeal Swab   Specimen: Nasopharyngeal Swab  Result Value Ref Range   SARS  Coronavirus 2 by RT PCR NEGATIVE NEGATIVE   Influenza A by PCR NEGATIVE NEGATIVE   Influenza B by PCR NEGATIVE NEGATIVE  Nasopharyngeal Culture   Specimen: Nasal Mucosa; Nasopharyngeal  Result Value Ref Range   Specimen Description NASOPHARYNGEAL    Special Requests NONE    Culture      CULTURE REINCUBATED FOR BETTER GROWTH Performed at [facility], [address], [place], [place] [address]    Report Status PENDING   Lipase, blood  Result Value Ref Range   Lipase 29 11 - 51 U/L  Comprehensive metabolic panel  Result Value Ref Range   Sodium 138 135 - 145 mmol/L   Potassium 3.6 3.5 - 5.1 mmol/L   Chloride 107 98 - 111 mmol/L   CO2 21 (L) 22 - 32 mmol/L   Glucose, Bld 94 70 - 99 mg/dL   BUN 9 6 - 20 mg/dL   Creatinine, Ser 0.58 0.44 - 1.00 mg/dL   Calcium 8.6 (L) 8.9 - 10.3 mg/dL   Total Protein 7.2 6.5 - 8.1 g/dL   Albumin 4.3 3.5 - 5.0 g/dL   AST 16 15 - 41 U/L   ALT 16 0 - 44 U/L   Alkaline Phosphatase 45 38 - 126 U/L   Total Bilirubin 0.5 0.3 - 1.2 mg/dL   GFR calc non Af Amer >60 >60 mL/min   GFR calc Af Amer >60 >60 mL/min   Anion gap 10 5 - 15  Urinalysis, Routine w reflex microscopic  Result Value Ref Range   Color, Urine STRAW (A) YELLOW   APPearance CLEAR CLEAR  Specific Gravity, Urine 1.008 1.005 - 1.030   pH 6.0 5.0 - 8.0   Glucose, UA NEGATIVE NEGATIVE mg/dL   Hgb urine dipstick NEGATIVE NEGATIVE   Bilirubin Urine NEGATIVE NEGATIVE   Ketones, ur 5 (A) NEGATIVE mg/dL   Protein, ur NEGATIVE NEGATIVE mg/dL   Nitrite NEGATIVE NEGATIVE   Leukocytes,Ua NEGATIVE NEGATIVE   RBC / HPF 0-5 0 - 5 RBC/hpf   WBC, UA 0-5 0 - 5 WBC/hpf   Bacteria, UA NONE SEEN NONE SEEN   Squamous Epithelial / LPF 0-5 0 - 5   Mucus PRESENT   CBC with Differential  Result Value Ref Range   WBC 6.1 4.0 - 10.5 K/uL   RBC 4.45 3.87 - 5.11 MIL/uL   Hemoglobin 13.7 12.0 - 15.0 g/dL   HCT 62.7 03.5 - 00.9 %   MCV 90.8 80.0 - 100.0 fL   MCH 30.8 26.0 - 34.0 pg   MCHC 33.9  30.0 - 36.0 g/dL   RDW 38.1 82.9 - 93.7 %   Platelets 120 (L) 150 - 400 K/uL   nRBC 0.0 0.0 - 0.2 %   Neutrophils Relative % 64 %   Neutro Abs 3.8 1.7 - 7.7 K/uL   Lymphocytes Relative 28 %   Lymphs Abs 1.7 0.7 - 4.0 K/uL   Monocytes Relative 6 %   Monocytes Absolute 0.4 0.1 - 1.0 K/uL   Eosinophils Relative 1 %   Eosinophils Absolute 0.1 0.0 - 0.5 K/uL   Basophils Relative 1 %   Basophils Absolute 0.1 0.0 - 0.1 K/uL   Immature Granulocytes 0 %   Abs Immature Granulocytes 0.01 0.00 - 0.07 K/uL  hCG, quantitative, pregnancy  Result Value Ref Range   hCG, Beta Chain, Quant, S 15,722 (H) <5 mIU/mL  APTT  Result Value Ref Range   aPTT 30 24 - 36 seconds  CBC  Result Value Ref Range   WBC 4.8 4.0 - 10.5 K/uL   RBC 4.28 3.87 - 5.11 MIL/uL   Hemoglobin 13.0 12.0 - 15.0 g/dL   HCT 16.9 67.8 - 93.8 %   MCV 89.5 80.0 - 100.0 fL   MCH 30.4 26.0 - 34.0 pg   MCHC 33.9 30.0 - 36.0 g/dL   RDW 10.1 75.1 - 02.5 %   Platelets 107 (L) 150 - 400 K/uL   nRBC 0.0 0.0 - 0.2 %  Fibrinogen  Result Value Ref Range   Fibrinogen 309 210 - 475 mg/dL  hCG, quantitative, pregnancy  Result Value Ref Range   hCG, Beta Chain, Quant, S 13,028 (H) <5 mIU/mL  Protime-INR  Result Value Ref Range   Prothrombin Time 13.9 11.4 - 15.2 seconds   INR 1.1 0.8 - 1.2  I-Stat beta hCG blood, ED  Result Value Ref Range   I-stat hCG, quantitative >2,000.0 (H) <5 mIU/mL   Comment 3          Type and screen  Result Value Ref Range   ABO/RH(D) O NEG    Antibody Screen NEG    Sample Expiration      05/09/2019,2359 Performed at [facility], [address], [place], [place] [address]   ABO/Rh  Result Value Ref Range   ABO/RH(D)      O NEG Performed at [facility], [address] [place], [place] [address]   Rhogam injection  Result Value Ref Range   Unit Number [id number]    Blood Component Type RHIG    Unit division 00  Status of Unit ISSUED,FINAL    Transfusion Status      OK TO  TRANSFUSE Performed at [facility], [address], [place], [place] [address]     Discharge Medications:   Allergies as of 05/07/2019   No Known Allergies     Medication List    TAKE these medications   aspirin-acetaminophen-caffeine 250-250-65 MG tablet Commonly known as: EXCEDRIN MIGRAINE Take 1 tablet by mouth every 6 (six) hours as needed for headache.   MULTIVITAMIN GUMMIES ADULT PO Take 1 [place] by mouth daily in the afternoon.   oxyCODONE 5 MG immediate release tablet Commonly known as: Oxy IR/ROXICODONE Take 1 tablet (5 mg total) by mouth every 6 (six) hours as needed for severe pain.       Diagnostic Studies: MR PELVIS WO CONTRAST  Result Date: 05/07/2019 CLINICAL DATA:  Follow-up abnormal ultrasound. EXAM: MRI PELVIS WITHOUT CONTRAST TECHNIQUE: Multiplanar multisequence MR imaging of the pelvis was performed. No intravenous contrast was administered. COMPARISON:  Ultrasound, same date. FINDINGS: The endometrium is slightly thickened but no gestational sac is identified. Area of slight decreased T2 signal intensity in the posterior myometrium could be a fibroid or scarring changes. Nabothian cysts are noted at the cervix. On the left side there is an area of slightly larger T2 signal in abnormality which could be a nabothian cyst. I do not see an obvious cervical gestational sac. The ovaries are normal except for a slightly collapsed left-sided corpus luteum cyst. Cystic appearing right adnexal structure with surrounding thick edematous appearing wall highly suspicious for right tubal ectopic pregnancy. IMPRESSION: MR findings highly suspicious for right tubal ectopic pregnancy. Thickened endometrium but no intrauterine gestational sac. Normal ovaries. These results were called by telephone at the time of interpretation on 05/07/2019 at 7:04 am to provider [facility] , who verbally acknowledged these results. Electronically Signed   By: Rudie Meyer M.D.   On:  05/07/2019 07:07   US OB LESS THAN 14 WEEKS WITH OB TRANSVAGINAL  Result Date: 05/06/2019 CLINICAL DATA:  Pelvic cramping. Pelvic pain, onset this morning. Gestational age [redacted] weeks 2 days based on last menstrual period of 03/23/2019. EXAM: OBSTETRIC <14 WK [place] AND TRANSVAGINAL OB US TECHNIQUE: Both transabdominal and transvaginal ultrasound examinations were performed for complete evaluation of the gestation as well as the maternal uterus, adnexal regions, and pelvic cul-de-sac. Transvaginal technique was performed to assess early pregnancy. COMPARISON:  None this pregnancy. FINDINGS: Intrauterine gestational sac: Possible small, however abnormally located in the cervix. Yolk sac:  Not Visualized. Embryo:  Not Visualized. Cardiac Activity: Not Visualized. MSD: 4.2 mm   5 w   1 d Subchorionic hemorrhage:  None visualized. Maternal uterus/adnexae: The uterus is anteverted. The endometrium measures 12-13 mm. Small cystic structure located in the cervix may represent a gestational sac, but is abnormally located. The right ovary appears normal measuring 3.4 x 2.1 x 2.1 cm. Blood flow is noted. Small amount of free fluid in the right adnexa and pelvis appears to be simple. The left ovary measures 3.4 x 2.0 x 3.4 cm and contains a hypoechoic 2.6 cm cyst which may represent a collapsing corpus luteum. Blood flow is noted. No extra ovarian adnexal mass. IMPRESSION: 1. Possible tiny gestational sac abnormally position in the cervix, but no yolk sac, fetal pole, or cardiac activity demonstrated. Findings are suspicious but not yet definitive for failed pregnancy. No other intrauterine gestational sac or intrauterine pregnancy. Recommend trending of beta HCG. Follow-up ultrasound could be  considered in 7-10 days based on beta hCG trending. 2. No adnexal mass or findings suspicious for ectopic pregnancy. 3. Small amount of free fluid in the right adnexa and dependent pelvis is nonspecific. Right ovary is well visualized and  appears normal. Probable collapsing corpus luteal cyst in the left ovary. Electronically Signed   By: Narda Rutherford M.D.   On: 05/06/2019 17:13    Disposition: Discharge disposition: 01-Home or Self Care       Discharge Instructions     Remove dressing in 72 hours   Complete by: As directed    Call MD for:  persistant nausea and vomiting   Complete by: As directed    Call MD for:  redness, tenderness, or signs of infection (pain, swelling, redness, odor or green/yellow discharge around incision site)   Complete by: As directed    Call MD for:  severe uncontrolled pain   Complete by: As directed    Call MD for:  temperature >100.4   Complete by: As directed    Diet - low sodium heart healthy   Complete by: As directed    Driving Restrictions   Complete by: As directed    None while taking narcotic pain meds   Increase activity slowly   Complete by: As directed    Lifting restrictions   Complete by: As directed    Nothing > 20 lbs x 2 weeks   Sexual Activity Restrictions   Complete by: As directed    None x 2 weeks      Follow-up Information    [facility]   Specialty: Obstetrics and Gynecology Why: postop check, they will call you with an appointment Contact information: [address], [id number] [place] [address] [phone]           Signed: Reva Bores 05/08/2019, 8:59 AM

## 2019-05-09 ENCOUNTER — Telehealth: Payer: Self-pay | Admitting: *Deleted

## 2019-05-09 LAB — SURGICAL PATHOLOGY

## 2019-05-09 NOTE — Telephone Encounter (Signed)
Pt called to discuss her concerns. She stated that she had surgery on 4/19 and her belly button incision is oozing clear liquid. She denies odor or bleeding from the incision. She also has sharp pain in that area that "feels like someone is stabbing me". Pt stated that yesterday she had a low grade fever of 99.6, however today it is 98.2. I consulted with Dr. Jolayne Panther who recommended pt to have office visit tomorrow.   1625  Pt was called back and advised of recommendation for office visit tomorrow @ 1420. She voiced understanding and agreed. Pt was also instructed to go to MAU if she develops a high fever (>100.4) bleeding @ the incision or intractable pain.

## 2019-05-10 ENCOUNTER — Encounter (HOSPITAL_COMMUNITY): Payer: Self-pay | Admitting: Emergency Medicine

## 2019-05-10 ENCOUNTER — Emergency Department (HOSPITAL_COMMUNITY): Payer: PRIVATE HEALTH INSURANCE

## 2019-05-10 ENCOUNTER — Other Ambulatory Visit: Payer: Self-pay

## 2019-05-10 ENCOUNTER — Encounter: Payer: PRIVATE HEALTH INSURANCE | Admitting: Obstetrics & Gynecology

## 2019-05-10 ENCOUNTER — Emergency Department (HOSPITAL_COMMUNITY)
Admission: EM | Admit: 2019-05-10 | Discharge: 2019-05-11 | Disposition: A | Discharge status: Home / Self Care | Payer: PRIVATE HEALTH INSURANCE | Attending: Emergency Medicine | Admitting: Emergency Medicine

## 2019-05-10 DIAGNOSIS — R197 Diarrhea, unspecified: Secondary | ICD-10-CM | POA: Diagnosis not present

## 2019-05-10 DIAGNOSIS — N200 Calculus of kidney: Secondary | ICD-10-CM | POA: Diagnosis not present

## 2019-05-10 DIAGNOSIS — R42 Dizziness and giddiness: Secondary | ICD-10-CM | POA: Insufficient documentation

## 2019-05-10 DIAGNOSIS — R1032 Left lower quadrant pain: Secondary | ICD-10-CM | POA: Diagnosis present

## 2019-05-10 DIAGNOSIS — G8918 Other acute postprocedural pain: Secondary | ICD-10-CM | POA: Insufficient documentation

## 2019-05-10 LAB — URINALYSIS, ROUTINE W REFLEX MICROSCOPIC
Bacteria, UA: NONE SEEN
Bilirubin Urine: NEGATIVE
Glucose, UA: NEGATIVE mg/dL
Ketones, ur: NEGATIVE mg/dL
Leukocytes,Ua: NEGATIVE
Nitrite: NEGATIVE
Protein, ur: NEGATIVE mg/dL
RBC / HPF: >50 RBC/hpf — ABNORMAL HIGH (ref 0–5)
Specific Gravity, Urine: 1.026 (ref 1.005–1.030)
pH: 8 (ref 5.0–8.0)

## 2019-05-10 LAB — CBC WITH DIFFERENTIAL/PLATELET
Abs Immature Granulocytes: 0.01 10*3/uL (ref 0.00–0.07)
Basophils Absolute: 0 10*3/uL (ref 0.0–0.1)
Basophils Relative: 1 %
Eosinophils Absolute: 0 10*3/uL (ref 0.0–0.5)
Eosinophils Relative: 1 %
HCT: 42.4 % (ref 36.0–46.0)
Hemoglobin: 14.1 g/dL (ref 12.0–15.0)
Immature Granulocytes: 0 %
Lymphocytes Relative: 13 %
Lymphs Abs: 0.8 10*3/uL (ref 0.7–4.0)
MCH: 29.9 pg (ref 26.0–34.0)
MCHC: 33.3 g/dL (ref 30.0–36.0)
MCV: 90 fL (ref 80.0–100.0)
Monocytes Absolute: 0.4 10*3/uL (ref 0.1–1.0)
Monocytes Relative: 6 %
Neutro Abs: 4.8 10*3/uL (ref 1.7–7.7)
Neutrophils Relative %: 79 %
Platelets: 129 10*3/uL — ABNORMAL LOW (ref 150–400)
RBC: 4.71 MIL/uL (ref 3.87–5.11)
RDW: 12.4 % (ref 11.5–15.5)
WBC: 6 10*3/uL (ref 4.0–10.5)
nRBC: 0 % (ref 0.0–0.2)

## 2019-05-10 LAB — COMPREHENSIVE METABOLIC PANEL
ALT: 19 U/L (ref 0–44)
AST: 17 U/L (ref 15–41)
Albumin: 4.5 g/dL (ref 3.5–5.0)
Alkaline Phosphatase: 46 U/L (ref 38–126)
Anion gap: 8 (ref 5–15)
BUN: 10 mg/dL (ref 6–20)
CO2: 26 mmol/L (ref 22–32)
Calcium: 9.6 mg/dL (ref 8.9–10.3)
Chloride: 107 mmol/L (ref 98–111)
Creatinine, Ser: 0.68 mg/dL (ref 0.44–1.00)
GFR calc Af Amer: >60 mL/min (ref >60)
GFR calc non Af Amer: >60 mL/min (ref >60)
Glucose, Bld: 100 mg/dL — ABNORMAL HIGH (ref 70–99)
Potassium: 3.7 mmol/L (ref 3.5–5.1)
Sodium: 141 mmol/L (ref 135–145)
Total Bilirubin: 0.6 mg/dL (ref 0.3–1.2)
Total Protein: 7.9 g/dL (ref 6.5–8.1)

## 2019-05-10 LAB — LACTIC ACID, PLASMA: Lactic Acid, Venous: 1.3 mmol/L (ref 0.5–1.9)

## 2019-05-10 LAB — I-STAT BETA HCG BLOOD, ED (MC, WL, AP ONLY): I-stat hCG, quantitative: 1367.8 m[IU]/mL — ABNORMAL HIGH (ref <5)

## 2019-05-10 LAB — HCG, QUANTITATIVE, PREGNANCY: hCG, Beta Chain, Quant, S: 1692 m[IU]/mL — ABNORMAL HIGH (ref <5)

## 2019-05-10 MED ORDER — IBUPROFEN 600 MG PO TABS: 600.0000 mg | ORAL_TABLET | Freq: Four times a day (QID) | ORAL | 0 refills | Status: DC | PRN

## 2019-05-10 MED ORDER — SODIUM CHLORIDE (PF) 0.9 % IJ SOLN
INTRAMUSCULAR | Status: AC
  Filled 2019-05-10: qty 50

## 2019-05-10 MED ORDER — ONDANSETRON 8 MG PO TBDP: ORAL_TABLET | ORAL | 0 refills | Status: DC

## 2019-05-10 MED ORDER — MORPHINE SULFATE (PF) 4 MG/ML IV SOLN
4.0000 mg | Freq: Once | INTRAVENOUS | Status: AC
  Administered 2019-05-10: 4 mg via INTRAVENOUS
  Filled 2019-05-10: qty 1

## 2019-05-10 MED ORDER — IOHEXOL 300 MG/ML  SOLN
100.0000 mL | Freq: Once | INTRAMUSCULAR | Status: AC | PRN
  Administered 2019-05-10: 19:00:00 100 mL via INTRAVENOUS

## 2019-05-10 MED ORDER — SODIUM CHLORIDE 0.9 % IV BOLUS (SEPSIS)
1000.0000 mL | Freq: Once | INTRAVENOUS | Status: AC
  Administered 2019-05-10: 1000 mL via INTRAVENOUS

## 2019-05-10 MED ORDER — PROMETHAZINE HCL 25 MG PO TABS: 25.0000 mg | ORAL_TABLET | Freq: Four times a day (QID) | ORAL | 0 refills | Status: DC | PRN

## 2019-05-10 MED ORDER — SODIUM CHLORIDE 0.9 % IV SOLN
1000.0000 mL | INTRAVENOUS | Status: DC
  Administered 2019-05-10: 1000 mL via INTRAVENOUS

## 2019-05-10 NOTE — ED Provider Notes (Signed)
[facility]-EMERGENCY DEPT Provider Note   CSN: [id number] Arrival date & time: 05/10/19  1522     History Chief Complaint  Patient presents with  . Post-op Problem    Sally Haas is a 24 y.o. female.  HPI Patient had laparoscopic right salpingectomy for ectopic pregnancy on 4\19\2021.  She reports today she started getting a lot of increasing pain in her abdomen.  At first it was on the right side but then migrated to the left.  It feels somewhat central.  Patient reports earlier in the day she felt lightheaded but has not had any syncopal episode.  She reports she is continue to have vaginal bleeding.  She reports today, in the waiting room when she went to the bathroom she had a stream of blood coming from her vagina.  She is nauseated but has not had any vomiting.  She reports she did have diarrhea today as well.  He is giving [place] codon to take for pain.  She reports she is taken a couple of tablets.  It has not helped much with the pain.  She was seen in the outpatient office by the nurse and recommended to go to the MAU.  Vital signs were stable at that time.    Past Medical History:  Diagnosis Date  . Thrombocytopenia [facility])     Patient Active Problem List   Diagnosis Date Noted  . Rh negative state in antepartum period 05/07/2019  . Hemoperitoneum due to rupture of right tubal ectopic pregnancy   . Abdominal pain in pregnancy 05/06/2019  . Pregnancy of unknown anatomic location 05/06/2019  . Thrombocytopenia ([facility]) 05/06/2019    Past Surgical History:  Procedure Laterality Date  . DIAGNOSTIC LAPAROSCOPY WITH REMOVAL OF ECTOPIC PREGNANCY Right 05/07/2019   Procedure: Laparoscopic Removal Of Ectopic Pregnancy;  Surgeon: Reva Bores, MD;  Location[facility] OR;  Service: Gynecology;  Laterality: Right;  . LAPAROSCOPIC CHOLECYSTECTOMY       OB History    Gravida  3   Para  1   Term  1   Preterm      AB  1   Living  1     SAB  1   TAB       Ectopic      Multiple      Live Births  1        Obstetric Comments  G1: early SAB G2: TSVD 2017         No family history on file.  Social History   Tobacco Use  . Smoking status: Never Smoker  . Smokeless tobacco: Never Used  Substance Use Topics  . Alcohol use: Not on file  . Drug use: Not on file    Home Medications Prior to Admission medications   Medication Sig Start Date End Date Taking? Authorizing Provider  aspirin-acetaminophen-caffeine (EXCEDRIN MIGRAINE) [phone] MG tablet Take 1 tablet by mouth every 6 (six) hours as needed for headache.   Yes [provider]  oxyCODONE (OXY IR/ROXICODONE) 5 MG immediate release tablet Take 1 tablet (5 mg total) by mouth every 6 (six) hours as needed for severe pain. 05/07/19  Yes Reva Bores, MD  ibuprofen (ADVIL) 600 MG tablet Take 1 tablet (600 mg total) by mouth every 6 (six) hours as needed. 05/10/19   Arby Barrette, MD  ondansetron (ZOFRAN ODT) 8 MG disintegrating tablet 8mg  ODT q4 hours prn nausea 05/10/19   05/12/19, MD  promethazine (PHENERGAN) 25  MG tablet Take 1 tablet (25 mg total) by mouth every 6 (six) hours as needed for nausea or vomiting. 05/10/19   Arby Barrette, MD    Allergies    Patient has no known allergies.  Review of Systems   Review of Systems 10 Systems reviewed and are negative for acute change except as noted in the HPI. Physical Exam Updated Vital Signs BP 106/61   Pulse 79   Temp 97.6 F (36.4 C) (Oral)   Resp 18   SpO2 98%   Physical Exam Constitutional:      Comments: Patient is alert and nontoxic.  No respiratory distress.  Color is good.  HENT:     Nose: Nose normal.     Mouth/Throat:     Mouth: Mucous membranes are moist.     Pharynx: Oropharynx is clear.  Eyes:     Extraocular Movements: Extraocular movements intact.  Cardiovascular:     Rate and Rhythm: Normal rate and regular rhythm.  Pulmonary:     Effort: Pulmonary effort is normal.      Breath sounds: Normal breath sounds.  Abdominal:     Comments: Clean dry laparoscopic surgical sites on the abdomen.  Was predominately tender in the midline around the umbilical area.  Moderate left lower quadrant tenderness.  Musculoskeletal:        General: No swelling or tenderness. Normal range of motion.     Right lower leg: No edema.     Left lower leg: No edema.  Skin:    General: Skin is warm and dry.     Coloration: Skin is not pale.  Neurological:     General: No focal deficit present.     Mental Status: She is oriented to person, place, and time.     Cranial Nerves: No cranial nerve deficit.     Coordination: Coordination normal.  Psychiatric:        Mood and Affect: Mood normal.     ED Results / Procedures / Treatments   Labs (all labs ordered are listed, but only abnormal results are displayed) Labs Reviewed  COMPREHENSIVE METABOLIC PANEL - Abnormal; Notable for the following components:      Result Value   Glucose, Bld 100 (*)    All other components within normal limits  CBC WITH DIFFERENTIAL/PLATELET - Abnormal; Notable for the following components:   Platelets 129 (*)    All other components within normal limits  URINALYSIS, ROUTINE W REFLEX MICROSCOPIC - Abnormal; Notable for the following components:   Color, Urine STRAW (*)    Hgb urine dipstick LARGE (*)    RBC / HPF >50 (*)    All other components within normal limits  HCG, QUANTITATIVE, PREGNANCY - Abnormal; Notable for the following components:   hCG, Beta Chain, Quant, S 1,692 (*)    All other components within normal limits  I-STAT BETA HCG BLOOD, ED ([facility], [facility], [facility] ONLY) - Abnormal; Notable for the following components:   I-stat hCG, quantitative 1,367.8 (*)    All other components within normal limits  URINE CULTURE  LACTIC ACID, PLASMA  TYPE AND SCREEN    EKG None  Radiology CT Abdomen Pelvis W Contrast  Result Date: 05/10/2019 CLINICAL DATA:  24 year old female with abdominal pain and  fever. Right tubal ectopic pregnancy status post laparoscopic removal. EXAM: CT ABDOMEN AND PELVIS WITH CONTRAST TECHNIQUE: Multidetector CT imaging of the abdomen and pelvis was performed using the standard protocol following bolus administration of intravenous contrast. CONTRAST:  160mL OMNIPAQUE IOHEXOL 300 MG/ML  SOLN COMPARISON:  Abdominal MRI dated 05/06/2019. FINDINGS: Lower chest: The visualized lung bases are clear. No intra-abdominal free air or free fluid. Hepatobiliary: The liver is unremarkable. No intrahepatic biliary ductal dilatation. Cholecystectomy. No retained calcified stone noted in the central CBD. Pancreas: Unremarkable. No pancreatic ductal dilatation or surrounding inflammatory changes. Spleen: Normal in size without focal abnormality. Adrenals/Urinary Tract: The adrenal glands are unremarkable. There is mild right hydronephrosis. There is a punctate stone in the right hemipelvis (67/2 and 80/4). This is along the expected course of the ureter but appears somewhat inverted in the right ovarian/adnexal tissue. The ureter distal to the ovary also appears slightly dilated. These findings suggest the punctate calcific focus to be associated with the ovary and less likely within the distal right ureter. The left kidney, left ureter, and urinary bladder appear unremarkable. Stomach/Bowel: There is no bowel obstruction or active inflammation. The appendix is normal. Vascular/Lymphatic: The abdominal aorta and IVC unremarkable. No portal venous gas. There is no adenopathy. Reproductive: The uterus and ovaries are grossly unremarkable. Other: There is induration of the umbilical fat, likely related to recent port placement. Musculoskeletal: No acute or significant osseous findings. IMPRESSION: 1. Mild right hydronephrosis which may be related to decrease peristalsis of the right ureter, reactive to recent surgery and less likely related to a punctate distal right ureter calculus. 2. No fluid  collection or abscess. 3. No bowel obstruction. Normal appendix. Electronically Signed   By: Anner Crete M.D.   On: 05/10/2019 20:02    Procedures Procedures (including critical care time)  Medications Ordered in ED Medications  sodium chloride 0.9 % bolus 1,000 mL (0 mLs Intravenous Stopped 05/10/19 1842)    Followed by  0.9 %  sodium chloride infusion (1,000 mLs Intravenous New Bag/Given 05/10/19 1811)  sodium chloride (PF) 0.9 % injection (has no administration in time range)  morphine 4 MG/ML injection 4 mg (4 mg Intravenous Given 05/10/19 1738)  iohexol (OMNIPAQUE) 300 MG/ML solution 100 mL (100 mLs Intravenous Contrast Given 05/10/19 1929)  morphine 4 MG/ML injection 4 mg (4 mg Intravenous Given 05/10/19 2148)    ED Course  I have reviewed the triage vital signs and the nursing notes.  Pertinent labs & imaging results that were available during my care of the patient were reviewed by me and considered in my medical decision making (see chart for details).  Clinical Course as of May 10 2314  Thu May 10, 2019  1730 Consult: Reviewed with Dr. Ilda Basset.  Advises to proceed with CT abdomen pelvis at [facility] emergency department.  Call back with results of CT and labs.   [MP]    Clinical Course User Index [MP] Charlesetta Shanks, MD   MDM Rules/Calculators/A&P                      Patient presents as outlined above.  She is 3 days status post salpingectomy for ectopic pregnancy.  Will get labs for blood count.  Patient's blood pressure stable.  She does not have pallor, confusion or respiratory distress.  Will review with MAU.  Notes indicate patient was supposed to be at MAU for evaluation.  Have ordered morphine for pain control and a fluid bolus.  Patient is nontoxic.  This time do not feel that empiric antibiotics are immediately indicated.  CT scan shows a very small right-sided kidney stone.  This should pass spontaneously.  No acute complications of surgery.  Scan and  results reviewed with Dr. Vergie Living.  At this time patient stable for discharge with pain control and follow-up on outpatient basis.  Results and return precautions reviewed with patient. Final Clinical Impression(s) / ED Diagnoses Final diagnoses:  Post-operative pain  Right kidney stone    Rx / DC Orders ED Discharge Orders         Ordered    ibuprofen (ADVIL) 600 MG tablet  Every 6 hours PRN     05/10/19 2315    ondansetron (ZOFRAN ODT) 8 MG disintegrating tablet     05/10/19 2315    promethazine (PHENERGAN) 25 MG tablet  Every 6 hours PRN     05/10/19 2315           Arby Barrette, MD 05/10/19 2317

## 2019-05-10 NOTE — ED Notes (Addendum)
Pt said when she went to the bathroom there was a stream of blood and she thinks the bleeding is getting worse.

## 2019-05-10 NOTE — ED Notes (Signed)
Attempted to obtain labs,unable 

## 2019-05-10 NOTE — Discharge Instructions (Addendum)
1.  You are likely still having some postoperative pain.  You may treat this with ibuprofen.  You also have a very small kidney stone.  This will pass on its own.  Stay well-hydrated. 2.  Follow-up with your gynecologist next week or as scheduled. 3.  Return to the emergency department if you develop fever, pain with urination, worsening abdominal pain or other concerning symptoms.

## 2019-05-10 NOTE — ED Triage Notes (Signed)
Per pt, states she has GYN surgery on Monday-follow up with GYN today-states they sent here due to severe LLQ pain

## 2019-05-10 NOTE — Progress Notes (Signed)
Pt in office with complaint of abdominal pain following lap removal of ectopic pregnancy on right side of abdomen on 05/07/19. Pt appears to be in severe pain; rigors present. All vital signs WNL at this time. Pt denies any recent fever. Pt reports 10/10 pain in right lower abdomen. States last medication taken was yesterday: oxycodone 5 mg. Pt has eaten today; small part of a hamburger. Advised pt to go to MAU. Pt's husband is here for transportation. Pt taken to car in wheelchair and able to transfer self into car. Report called to MAU.   Fleet Contras RN 05/10/19

## 2019-05-11 LAB — NASOPHARYNGEAL CULTURE: Culture: NOT DETECTED

## 2019-05-11 LAB — TYPE AND SCREEN
ABO/RH(D): O NEG
Antibody Screen: POSITIVE

## 2019-05-13 LAB — URINE CULTURE: Culture: 40000 — AB

## 2019-05-14 ENCOUNTER — Telehealth: Payer: Self-pay | Admitting: Emergency Medicine

## 2019-05-14 NOTE — Telephone Encounter (Signed)
Post ED Visit - Positive Culture Follow-up  Culture report reviewed by antimicrobial stewardship pharmacist: [facility] []  , Pharm.D. []  Enzo Bi, .D., BCPS AQ-ID []  Celedonio Miyamoto, Pharm.D., BCPS []  [address], Pharm.D., BCPS []  [place], Garvin Fila.D., BCPS, AAHIVP []  , Pharm.D., BCPS, AAHIVP []  Georgina Pillion, PharmD, BCPS []  , PharmD, BCPS []  [place], PharmD, BCPS []  [place], PharmD []  , PharmD, BCPS []  Estella Husk, PharmD  Pharmacy Team []  Lysle Pearl, PharmD []  , PharmD []  Phillips Climes, PharmD [x]  , Rph []  Agapito Games) , PharmD []  Verlan Friends, PharmD []  , PharmD []  Mervyn Gay, PharmD []  , PharmD []  Vinnie Level, PharmD []  W[facility] Old[facility], PharmD []  , PharmD []  Len Childs, PharmD   Positive urine culture Treated with none, asymptomatic, no further treatment needed 05/14/2019, 1:03 PM

## 2019-05-16 NOTE — Progress Notes (Signed)
Patient ID: Sally Haas, female   DOB: 1995-11-25, 24 y.o.   MRN: [id number] Patient seen and assessed by nursing staff during this encounter. I have reviewed the chart and agree with the documentation and plan. I have also made any necessary editorial changes.  Scheryl Darter, MD 05/16/2019 10:48 AM

## 2019-05-31 ENCOUNTER — Other Ambulatory Visit: Payer: Self-pay

## 2019-05-31 ENCOUNTER — Encounter: Payer: Self-pay | Admitting: Family Medicine

## 2019-05-31 ENCOUNTER — Ambulatory Visit (INDEPENDENT_AMBULATORY_CARE_PROVIDER_SITE_OTHER): Payer: PRIVATE HEALTH INSURANCE | Admitting: Family Medicine

## 2019-05-31 VITALS — BP 114/73 | HR 77 | Wt 186.7 lb

## 2019-05-31 DIAGNOSIS — Z09 Encounter for follow-up examination after completed treatment for conditions other than malignant neoplasm: Secondary | ICD-10-CM

## 2019-05-31 DIAGNOSIS — Z48816 Encounter for surgical aftercare following surgery on the genitourinary system: Secondary | ICD-10-CM

## 2019-05-31 DIAGNOSIS — Z1331 Encounter for screening for depression: Secondary | ICD-10-CM

## 2019-05-31 NOTE — Patient Instructions (Signed)
[facility] primary care offices accepting new patients:   [facility] [place] [address] [place], [place] [address] [phone]  [facility] [place] [address] [place], [place] [address] [phone]  [facility] [address] [place], [place] [address] [phone]  [facility] [address] [place], [place] [address] [phone]  [facility] [address] [place],  [place]  [address] [phone]  

## 2019-05-31 NOTE — Progress Notes (Signed)
   Subjective:    Patient ID: Sally Haas is a 24 y.o. female presenting with Post-op Follow-up  on 05/31/2019  HPI: Here today for f/u after a dx scope for ectopic. She is not interested in having another baby right now. She also had a kidney stone and her pain from that has resolved. + PHQ9.  Review of Systems  Constitutional: Negative for chills and fever.  Respiratory: Negative for shortness of breath.   Cardiovascular: Negative for chest pain.  Gastrointestinal: Negative for abdominal pain, nausea and vomiting.  Genitourinary: Negative for dysuria.  Skin: Negative for rash.      Objective:    BP 114/73   Pulse 77   Wt 186 lb 11.2 oz (84.7 kg)   Breastfeeding No   BMI 31.07 kg/m  Physical Exam Constitutional:      General: She is not in acute distress.    Appearance: She is well-developed.  HENT:     Head: Normocephalic and atraumatic.  Eyes:     General: No scleral icterus. Cardiovascular:     Rate and Rhythm: Normal rate.  Pulmonary:     Effort: Pulmonary effort is normal.  Abdominal:     Palpations: Abdomen is soft.  Musculoskeletal:     Cervical back: Neck supple.  Skin:    General: Skin is warm and dry.     Comments: Incisions are well healed  Neurological:     Mental Status: She is alert and oriented to person, place, and time.         Assessment & Plan:  Positive depression screening - Plan: Ambulatory referral to Integrated Behavioral Health  Postop check - Doing well.  Return in about 4 weeks (around 06/28/2019) for a CPE and IUD insertion.  Reva Bores 05/31/2019 1:29 PM

## 2019-06-07 NOTE — BH Specialist Note (Signed)
Integrated Behavioral Health via Telemedicine Video Visit  06/07/2019 Sally Haas [id number]  Number of Integrated Behavioral Health visits: 1 Session Start time: 3:19 Session End time: 4:25 Total time: 101  Referring Provider: Tinnie Gens, MD Type of Visit: Video Patient/Family location: Home [facility] Provider location: [facility]  All persons participating in visit: [name] and [facility] Magali Bray    Confirmed patient's address: Yes  Confirmed patient's phone number: Yes  Any changes to demographics: No   Confirmed patient's insurance: Yes  Any changes to patient's insurance: No   Discussed confidentiality: Yes   I connected with Sally Haas  by a video enabled telemedicine application and verified that I am speaking with the correct person using two identifiers.     I discussed the limitations of evaluation and management by telemedicine and the availability of in person appointments.  I discussed that the purpose of this visit is to provide behavioral health care while limiting exposure to the novel coronavirus.   Discussed there is a possibility of technology failure and discussed alternative modes of communication if that failure occurs.  I discussed that engaging in this video visit, they consent to the provision of behavioral healthcare and the services will be billed under their insurance.  Patient and/or legal guardian expressed understanding and consented to video visit: Yes   PRESENTING CONCERNS: Patient and/or family reports the following symptoms/concerns: Pt states her primary concern today is mood instability affecting perception of job performance; pt's goals are to stabilize her mood and go back to school, and establish care with a PCP.   Duration of problem: Increase over time; Severity of problem: moderate  STRENGTHS (Protective Factors/Coping Skills): Supportive husband, good  self-awareness and open to learning new skills   GOALS ADDRESSED: Patient will: 1.  Reduce symptoms of: mood instability  2.  Increase knowledge and/or ability of: self-management skills  3.  Demonstrate ability to: Increase healthy adjustment to current life circumstances and Increase motivation to adhere to plan of care  INTERVENTIONS: Interventions utilized:  Mindfulness or Management consultant and Psychoeducation and/or Health Education Standardized Assessments completed: MDQ today; PHQ9/GAD7 given in past two weeks  ASSESSMENT: Patient currently experiencing Mood disorder.   Patient may benefit from psychoeducation and brief therapeutic interventions regarding coping with symptoms of mood instability .  PLAN: 1. Follow up with behavioral health clinician on : Two weeks 2. Behavioral recommendations:  -CALM relaxation breathing exercise twice daily (morning; at bedtime with sleep sounds) -Accept referral to psychiatry -Establish care with PCP (list of possible PCPs on After Visit Summary) -Consider apps, as discussed, for additional self-care (work breaks, etc)  3. Referral(s): Integrated Art gallery manager (In Clinic) and [employer] [facility] (LME/Outside Clinic)  I discussed the assessment and treatment plan with the patient and/or parent/guardian. They were provided an opportunity to ask questions and all were answered. They agreed with the plan and demonstrated an understanding of the instructions.   They were advised to call back or seek an in-person evaluation if the symptoms worsen or if the condition fails to improve as anticipated.  Valetta Close Jeriah Skufca  Depression screen [facility] 2/9 05/31/2019 05/10/2019  Decreased Interest 1 0  Down, Depressed, Hopeless 1 0  PHQ - 2 Score 2 0  Altered sleeping 2 0  Tired, decreased energy 3 0  Change in appetite 3 0  Feeling bad or failure about yourself  1 0  Trouble concentrating 0 0  Moving slowly or  fidgety/restless 0 0  Suicidal thoughts 0 0  PHQ-9 Score 11 0  Difficult doing work/chores - Not difficult at all   GAD 7 : Generalized Anxiety Score 05/31/2019 05/10/2019  Nervous, Anxious, on Edge 2 1  Control/stop worrying 1 0  Worry too much - different things 0 0  Trouble relaxing 0 0  Restless 0 0  Easily annoyed or irritable 0 1  Afraid - awful might happen 0 0  Total GAD 7 Score 3 2

## 2019-06-11 ENCOUNTER — Encounter: Payer: Self-pay | Admitting: Clinical

## 2019-06-11 ENCOUNTER — Other Ambulatory Visit: Payer: Self-pay

## 2019-06-11 ENCOUNTER — Ambulatory Visit (INDEPENDENT_AMBULATORY_CARE_PROVIDER_SITE_OTHER): Payer: PRIVATE HEALTH INSURANCE | Admitting: Clinical

## 2019-06-11 DIAGNOSIS — F39 Unspecified mood [affective] disorder: Secondary | ICD-10-CM | POA: Diagnosis not present

## 2019-06-11 NOTE — Patient Instructions (Signed)
[facility] [address] [place], [place] [address] [phone] (main office) [phone] (Ashwin Tibbs's office)  [place] primary care offices accepting new patients:   [facility] [address] [place], [place] [address] [phone]  [facility] [address] [place], [place] [address] [phone]  [facility] [address] [place], [place] [address] [phone]  [facility] [address] [place], [place] [address] [phone]  [facility] [address] [place],  [place]  [address] [phone]  /Emotional Wellbeing Apps and Websites Here are a few free apps meant to help you to help yourself.  To find, try searching on the internet to see if the app is offered on Apple/Android devices. If your first choice doesn't come up on your device, the good news is that there are many choices! Play around with different apps to see which ones are helpful to you.    Calm This is an app meant to help increase calm feelings. Includes info, strategies, and tools for tracking your feelings.      Calm Harm  This app is meant to help with self-harm. Provides many 5-minute or 15-min coping strategies for doing instead of hurting yourself.       Healthy Minds Health Minds is a problem-solving tool to help deal with emotions and cope with stress you encounter wherever you are.      MindShift This app can help people cope with anxiety. Rather than trying to avoid anxiety, you can make an important shift and face it.      MY3  MY3 features a support system, safety plan and resources with the goal of offering a tool to use in a time of need.       My Life My Voice  This mood journal offers a simple solution for tracking your thoughts, feelings and moods. Animated emoticons can help identify your mood.        Relax Melodies Designed to help with sleep, on this app you can mix sounds and meditations for relaxation.      Smiling Mind Smiling Mind is meditation made easy: it's a simple tool that helps put a smile on your mind.        Stop, Breathe & Think  A friendly, simple guide for people through meditations for mindfulness and compassion.  Stop, Breathe and Think Kids Enter your current feelings and choose a "mission" to help you cope. Offers videos for certain moods instead of just sound recordings.       Team Orange The goal of this tool is to help teens change how they think, act, and react. This app helps you focus on your own good feelings and experiences.      The United Stationers Box The United Stationers Box (VHB) contains simple tools to help patients with coping, relaxation, distraction, and positive thinking.    Behavioral Health Resources:   What if I or someone I know is in crisis?  . If you are thinking about harming yourself or having thoughts of suicide, or if you know someone who is, seek help right away.  . Call your doctor or mental health care provider.  . Call 911 or go to a hospital emergency room to get immediate help, or ask a friend or family member to help you do these things.  . Call the [place] National Suicide Prevention Lifeline's toll-free,  24-hour hotline at 1-800-273-TALK 575-783-6996) or TTY: 1-800-799-4 TTY (252) 377-3812) to talk to a trained counselor.  . If you are in crisis, make sure you are not left alone.   . If someone else is in crisis, make sure he or she is not left alone   24 Hour :   [place] National Suicide Hotline: 260-324-3601  Therapeutic Alternative Mobile Crisis: 702-355-0295   [facility]  [address], [place], [place] [address]  [phone] or [phone]  Family Service of the [employer] (Domestic Violence, Rape & Victim Assistance)  828-184-0779  [employer] [facility]  [facility]  [address], [place]  [address]   [phone] or [phone]   [facility] [employer] Crisis Services: [phone] (8am-4pm) or [phone] (after hours)           [facility], [address], [place], [place]  [phone] Fax: [phone] [email or web address]://www.[email or web address]  *Interpreters available *Accepts Medicaid, Medicare, uninsured  [facility]   Mon-Fri: 8am-5pm [address], [place], [place] [phone](JOITG); (713)[phone](fax) https://www.[email or web address]  *Accepts Medicare  [facility] [place], Fri: 8am-4pm [address], [place], [place]  [phone] (phone); [phone] (fax) ExShows.dk  *Accepts Medicare  [facility] Mon-Fri: 9am-5pm  [address] [place], [place] [phone] (phone); [phone]  MommyCollege.dk  *Accepts Medicaid  [facility] [address] [place], [place]  [phone]  https://www.grant.info/   [facility] [place], 8:30am-12pm/1pm-2:30pm [address] [place], [place] [phone] (phone); ([phone] (fax) www.fspcares.org  *Accepts Medicaid, sliding-scale*Bilingual services available  [facility] Mon-Fri, 8am-7pm [address] [place], [place]  [phone](XIDHW); [phone](fax) www.famsolutions.org  *Accepts Medicaid *Bilingual services available  [facility] Mon-Fri: 8am-5pm, Saturday by appointment only [address] [place], [place], [place] [phone] (phone); [phone] (fax) www.[email or web address]   [facility] [address], [place], [place] [phone] www.[email or web address]  *Free & reduced services for uninsured and underinsured  individuals *Bilingual services for Spanish-speaking clients 21 and under  [facility], [address], [place], [place] [phone](LIDCV); ([phone](fax) KittenExchange.at  *Bring your own interpreter at first visit *Accepts Medicare and [facility]  [facility] Mon-Fri: 9am-5:30pm [address], [place], [place] [phone] (phone), [phone] (fax) After hours crisis line: [phone] www.neuropsychcarecenter.com  *Accepts Medicare and Medicaid  [employer], 8am-6pm [address] [place], [place]  [phone] (phone); [phone] (fax) http://presbyteriancounseling.org  *Subsidized costs available  [facility] Mon-Fri: 8am-4pm [address], [place], [place] [phone](PCHEK); [phone](fax) www.psychotherapeuticservices.com  *Accepts Medicaid  [facility] [place] Same day access hours: Mon-Fri, 8:30-3pm Crisis hours: Mon-Fri, 8am-5pm [address], [place], [place]  [facility] [employer] Same day access hours: Mon-Fri, 8:30-3pm Crisis hours: Mon-Fri, 8am-8pm [address], [place], [place] [phone] (phone); [phone] (fax) www.rhahealthservices.org  *Accepts Medicaid and Medicare  [facility] [place], [place], Fri: 9am-9pm Tues, Thurs: 9am-6pm [address] [place], [place], [place]  [phone] (phone); [phone] (fax) https://[email or web address]  *(Accepts Medicare and Medicaid; payment plans available)*Bilingual services available  [facility] [address] [place], [place] [phone] (phone); [phone] (fax) www.santecounseling.com   [facility] [address], [place], [place]  [phone]  RackRewards.fr  *Bilingual services available  [facility]otional [facility]) Mon-Thurs: 8am-8pm [address], [place], [place] [phone] (phone); [phone] (fax) ScrapbookLive.si  *Accepts Medicaid*Bilingual services available  [facility] [address], [place], [place] [phone] (  phone) DeadConnect.com.cy  *Accepts Medicaid *Bilingual services available  [facility] Mon-Fri, 9am-4:45pm [address] [place], [place] (phone); [phone] (fax) http://tlc-[email or web address]  *Accepts Medicare  [facility] Mon-Thurs: 8:30-8pm, Fri: 8:30am-7pm [address], [place], [place] (3rd floor) [phone] (phone); [phone] (fax) VIPinterview.si  *Accepts Medicaid; income-based reduced rates available  [facility] Mon-Fri: 8am-5pm [address] [place], [place], [place] (phone); ([phone] (fax) http://www.[email or web address]  *Accepts Medicaid*Bilingual services available  [place], [place], [place], [place] (phone); [phone] (fax) www.youthfocus.org  *Free emergency housing and clinical services for youth in crisis  [facility] ([place])  [address] [place] [phone] www.mhag.org  *Provides direct services to individuals in recovery from mental illness, including support groups, recovery skills classes, and one on one peer support  [employer] on [place]) [place] helpline: 707-822-6660  https://namiguilford.org  *A community hub for information relating to local resources and services for the friends and families of individuals living alongside a mental health condition, as well as the individuals themselves. Classes and support groups also provided

## 2019-06-21 NOTE — BH Specialist Note (Deleted)
Integrated Behavioral Health via Telemedicine Video Visit  06/21/2019 Sally Haas [id number]  Number of Integrated Behavioral Health visits: 2 Session Start time: 1:45***  Session End time: 2:15 Total time: {IBH Total HRCB:63845364}  Referring Provider: Tinnie Gens, MD Type of Visit: Video Patient/Family location: Home [facility] Provider location: [facility]  All persons participating in visit: Patient *** and [facility] [name] ***   Confirmed patient's address: Yes  Confirmed patient's phone number: Yes  Any changes to demographics: No   Confirmed patient's insurance: Yes  Any changes to patient's insurance: No   Discussed confidentiality: at previous visit  I connected with Sally Haas  by a video enabled telemedicine application and verified that I am speaking with the correct person using two identifiers.     I discussed the limitations of evaluation and management by telemedicine and the availability of in person appointments.  I discussed that the purpose of this visit is to provide behavioral health care while limiting exposure to the novel coronavirus.   Discussed there is a possibility of technology failure and discussed alternative modes of communication if that failure occurs.  I discussed that engaging in this video visit, they consent to the provision of behavioral healthcare and the services will be billed under their insurance.  Patient and/or legal guardian expressed understanding and consented to video visit: Yes   PRESENTING CONCERNS: Patient and/or family reports the following symptoms/concerns: *** Duration of problem: ***; Severity of problem: {Mild/Moderate/Severe:20260}  STRENGTHS (Protective Factors/Coping Skills): Supportive husband, good self-awareness; utilizing self-coping strategies ***  GOALS ADDRESSED: Patient will: 1.  Reduce symptoms of: mood instability *** 2.  Increase knowledge  and/or ability of: {IBH Patient Tools:21014057}  3.  Demonstrate ability to: {IBH Goals:21014053}  INTERVENTIONS: Interventions utilized:  {IBH Interventions:21014054} Standardized Assessments completed: {IBH Screening Tools:21014051}  ASSESSMENT: Patient currently experiencing Mood disorder, unspecified ***.   Patient may benefit from continued psychoeducation and brief therapeutic interventions regarding coping with symptoms of *** .  PLAN: 1. Follow up with behavioral health clinician on : *** 2. Behavioral recommendations:  -*** -***(calm, ref psychiatry, pcp, apps)*** 3. Referral(s): {IBH Referrals:21014055}  I discussed the assessment and treatment plan with the patient and/or parent/guardian. They were provided an opportunity to ask questions and all were answered. They agreed with the plan and demonstrated an understanding of the instructions.   They were advised to call back or seek an in-person evaluation if the symptoms worsen or if the condition fails to improve as anticipated.  Valetta Close [name]

## 2019-06-25 ENCOUNTER — Emergency Department (HOSPITAL_COMMUNITY): Payer: PRIVATE HEALTH INSURANCE

## 2019-06-25 ENCOUNTER — Emergency Department (HOSPITAL_COMMUNITY)
Admission: EM | Admit: 2019-06-25 | Discharge: 2019-06-26 | Disposition: A | Discharge status: Home / Self Care | Payer: PRIVATE HEALTH INSURANCE | Attending: Emergency Medicine | Admitting: Emergency Medicine

## 2019-06-25 ENCOUNTER — Telehealth (INDEPENDENT_AMBULATORY_CARE_PROVIDER_SITE_OTHER): Payer: PRIVATE HEALTH INSURANCE | Admitting: Psychiatry

## 2019-06-25 ENCOUNTER — Ambulatory Visit (INDEPENDENT_AMBULATORY_CARE_PROVIDER_SITE_OTHER)
Admission: EM | Admit: 2019-06-25 | Discharge: 2019-06-25 | Disposition: A | Discharge status: Home / Self Care | Payer: PRIVATE HEALTH INSURANCE | Source: Home / Self Care | Attending: Family Medicine | Admitting: Family Medicine

## 2019-06-25 ENCOUNTER — Other Ambulatory Visit: Payer: Self-pay

## 2019-06-25 ENCOUNTER — Encounter (HOSPITAL_COMMUNITY): Payer: Self-pay

## 2019-06-25 ENCOUNTER — Encounter (HOSPITAL_COMMUNITY): Payer: Self-pay | Admitting: Psychiatry

## 2019-06-25 DIAGNOSIS — F3181 Bipolar II disorder: Secondary | ICD-10-CM

## 2019-06-25 DIAGNOSIS — Z3201 Encounter for pregnancy test, result positive: Secondary | ICD-10-CM

## 2019-06-25 DIAGNOSIS — O26891 Other specified pregnancy related conditions, first trimester: Secondary | ICD-10-CM | POA: Insufficient documentation

## 2019-06-25 DIAGNOSIS — Z3A01 Less than 8 weeks gestation of pregnancy: Secondary | ICD-10-CM | POA: Insufficient documentation

## 2019-06-25 DIAGNOSIS — R0789 Other chest pain: Secondary | ICD-10-CM | POA: Insufficient documentation

## 2019-06-25 DIAGNOSIS — R0602 Shortness of breath: Secondary | ICD-10-CM | POA: Diagnosis not present

## 2019-06-25 DIAGNOSIS — R5383 Other fatigue: Secondary | ICD-10-CM | POA: Diagnosis not present

## 2019-06-25 DIAGNOSIS — R102 Pelvic and perineal pain: Secondary | ICD-10-CM | POA: Diagnosis not present

## 2019-06-25 DIAGNOSIS — R42 Dizziness and giddiness: Secondary | ICD-10-CM | POA: Diagnosis not present

## 2019-06-25 DIAGNOSIS — F411 Generalized anxiety disorder: Secondary | ICD-10-CM | POA: Diagnosis not present

## 2019-06-25 DIAGNOSIS — R079 Chest pain, unspecified: Secondary | ICD-10-CM

## 2019-06-25 DIAGNOSIS — R1030 Lower abdominal pain, unspecified: Secondary | ICD-10-CM

## 2019-06-25 LAB — BASIC METABOLIC PANEL
Anion gap: 8 (ref 5–15)
BUN: 7 mg/dL (ref 6–20)
CO2: 22 mmol/L (ref 22–32)
Calcium: 8.9 mg/dL (ref 8.9–10.3)
Chloride: 106 mmol/L (ref 98–111)
Creatinine, Ser: 0.57 mg/dL (ref 0.44–1.00)
GFR calc Af Amer: >60 mL/min (ref >60)
GFR calc non Af Amer: >60 mL/min (ref >60)
Glucose, Bld: 85 mg/dL (ref 70–99)
Potassium: 3.8 mmol/L (ref 3.5–5.1)
Sodium: 136 mmol/L (ref 135–145)

## 2019-06-25 LAB — POCT URINALYSIS DIP (DEVICE)
Bilirubin Urine: NEGATIVE
Glucose, UA: NEGATIVE mg/dL
Hgb urine dipstick: NEGATIVE
Nitrite: NEGATIVE
Protein, ur: NEGATIVE mg/dL
Specific Gravity, Urine: 1.025 (ref 1.005–1.030)
Urobilinogen, UA: 1 mg/dL (ref 0.0–1.0)
pH: 7 (ref 5.0–8.0)

## 2019-06-25 LAB — CBC
HCT: 40.7 % (ref 36.0–46.0)
Hemoglobin: 13.6 g/dL (ref 12.0–15.0)
MCH: 30.4 pg (ref 26.0–34.0)
MCHC: 33.4 g/dL (ref 30.0–36.0)
MCV: 91.1 fL (ref 80.0–100.0)
Platelets: 130 10*3/uL — ABNORMAL LOW (ref 150–400)
RBC: 4.47 MIL/uL (ref 3.87–5.11)
RDW: 12.2 % (ref 11.5–15.5)
WBC: 4.8 10*3/uL (ref 4.0–10.5)
nRBC: 0 % (ref 0.0–0.2)

## 2019-06-25 LAB — I-STAT BETA HCG BLOOD, ED (MC, WL, AP ONLY): I-stat hCG, quantitative: >2000 m[IU]/mL — ABNORMAL HIGH (ref <5)

## 2019-06-25 LAB — POC URINE PREG, ED: Preg Test, Ur: POSITIVE — AB

## 2019-06-25 LAB — TROPONIN I (HIGH SENSITIVITY): Troponin I (High Sensitivity): <2 ng/L (ref <18)

## 2019-06-25 MED ORDER — SODIUM CHLORIDE 0.9% FLUSH: 3.0000 mL | Freq: Once | INTRAVENOUS | Status: DC

## 2019-06-25 MED ORDER — SODIUM CHLORIDE 0.9 % IV BOLUS
1000.0000 mL | Freq: Once | INTRAVENOUS | Status: AC
  Administered 2019-06-26: 1000 mL via INTRAVENOUS

## 2019-06-25 MED ORDER — LAMOTRIGINE 25 MG PO TABS: 25.0000 mg | ORAL_TABLET | Freq: Every day | ORAL | 0 refills | Status: DC

## 2019-06-25 MED ORDER — ALUM & MAG HYDROXIDE-SIMETH 200-200-20 MG/5ML PO SUSP
ORAL | Status: AC
  Filled 2019-06-25: qty 30

## 2019-06-25 MED ORDER — ONDANSETRON HCL 4 MG/2ML IJ SOLN
4.0000 mg | Freq: Once | INTRAMUSCULAR | Status: AC
  Administered 2019-06-26: 4 mg via INTRAVENOUS
  Filled 2019-06-25: qty 2

## 2019-06-25 MED ORDER — ALUM & MAG HYDROXIDE-SIMETH 200-200-20 MG/5ML PO SUSP: 30.0000 mL | Freq: Once | ORAL | Status: DC

## 2019-06-25 MED ORDER — LIDOCAINE VISCOUS HCL 2 % MT SOLN: 15.0000 mL | Freq: Once | OROMUCOSAL | Status: DC

## 2019-06-25 MED ORDER — LIDOCAINE VISCOUS HCL 2 % MT SOLN
OROMUCOSAL | Status: AC
  Filled 2019-06-25: qty 15

## 2019-06-25 NOTE — ED Notes (Signed)
Left sided lower abdominal pain that started this weekend.  "feels so similar to last ectopic pregnancy." Other symptoms include nausea, light headedness.

## 2019-06-25 NOTE — ED Provider Notes (Signed)
[facility] Provider Note   CSN: [id number] Arrival date & time: 06/25/19  1508     History Chief Complaint  Patient presents with  . Chest Pain  . Abdominal Cramping    Sally Haas is a 24 y.o. female.  HPI      DESSIREE Haas is a 24 y.o. female, with a history of thrombocytopenia, Rh neg, right tubal ectopic pregnancy, presenting to the ED with left lower abdominal pain beginning a couple days ago. Pain is intermittent and then waxing and waning, sharp, sometimes dull, moderate in intensity when the pain arises, nonradiating. Accompanied by nausea. She states she had an ectopic pregnancy April 2021 where the right fallopian tube was affected and subsequently removed.  She reports her current pain feels similar.  She states she is currently pregnant, however, had not started her period again after her ectopic. She does endorse loose stools that she only experiences after eating a meal beginning last week.  She also complains of chest discomfort beginning around June 2. Discomfort feels like a pressure, constant across the chest, 4/10, nonradiating.  Accompanied by shortness of breath.  Symptoms are worse with exertion.  Denies history of PE/DVT. Denies fever/chills, cough, vomiting, hematochezia/melena, vaginal bleeding, abnormal vaginal discharge, lower extremity pain/swelling, syncope, urinary symptoms, or any other complaints.    Past Medical History:  Diagnosis Date  . Thrombocytopenia [facility])     Patient Active Problem List   Diagnosis Date Noted  . Rh negative state in antepartum period 05/07/2019  . Hemoperitoneum due to rupture of right tubal ectopic pregnancy   . Abdominal pain in pregnancy 05/06/2019  . Pregnancy of unknown anatomic location 05/06/2019  . Thrombocytopenia ([place]) 05/06/2019    Past Surgical History:  Procedure Laterality Date  . DIAGNOSTIC LAPAROSCOPY WITH REMOVAL OF ECTOPIC PREGNANCY Right  05/07/2019   Procedure: Laparoscopic Removal Of Ectopic Pregnancy;  Surgeon: Donnamae Jude, MD;  Location: [facility];  Service: Gynecology;  Laterality: Right;  . LAPAROSCOPIC CHOLECYSTECTOMY       OB History    Gravida  3   Para  1   Term  1   Preterm      AB  1   Living  1     SAB  1   TAB      Ectopic      Multiple      Live Births  1        Obstetric Comments  G1: early SAB G2: TSVD 2017         Family History  Problem Relation Age of Onset  . Alcohol abuse Father     Social History   Tobacco Use  . Smoking status: Never Smoker  . Smokeless tobacco: Never Used  Substance Use Topics  . Alcohol use: Not on file  . Drug use: Not on file    Home Medications Prior to Admission medications   Medication Sig Start Date End Date Taking? Authorizing Provider  aspirin-acetaminophen-caffeine (EXCEDRIN MIGRAINE) [phone] MG tablet Take 1 tablet by mouth every 6 (six) hours as needed for headache.    [provider]  lamoTRIgine (LAMICTAL) 25 MG tablet Take 1 tablet (25 mg total) by mouth daily. Take one tablet daily for a week and then start taking 2 tablets. 06/25/19   Merian Capron, MD    Allergies    Patient has no known allergies.  Review of Systems   Review of Systems  Constitutional: Negative for  chills, diaphoresis and fever.  Respiratory: Positive for shortness of breath. Negative for cough.   Cardiovascular: Positive for chest pain. Negative for leg swelling.  Gastrointestinal: Positive for abdominal pain, diarrhea and nausea. Negative for blood in stool and vomiting.  Genitourinary: Negative for dysuria, frequency, hematuria, vaginal bleeding and vaginal discharge.  Musculoskeletal: Negative for back pain.  Neurological: Negative for dizziness and syncope.  All other systems reviewed and are negative.   Physical Exam Updated Vital Signs BP 109/60   Pulse 68   Temp 98.8 F (37.1 C) (Oral)   Resp 16   Ht 5\' 5"  (1.651 m)   Wt 85  kg   LMP  (LMP Unknown)   SpO2 99%   BMI 31.18 kg/m   Physical Exam Vitals and nursing note reviewed.  Constitutional:      General: She is not in acute distress.    Appearance: She is well-developed. She is not diaphoretic.  HENT:     Head: Normocephalic and atraumatic.     Mouth/Throat:     Mouth: Mucous membranes are moist.     Pharynx: Oropharynx is clear.  Eyes:     Conjunctiva/sclera: Conjunctivae normal.  Cardiovascular:     Rate and Rhythm: Normal rate and regular rhythm.     Pulses: Normal pulses.          Radial pulses are 2+ on the right side and 2+ on the left side.       Posterior tibial pulses are 2+ on the right side and 2+ on the left side.     Heart sounds: Normal heart sounds.     Comments: Tactile temperature in the extremities appropriate and equal bilaterally. Pulmonary:     Effort: Pulmonary effort is normal. No respiratory distress.     Breath sounds: Normal breath sounds.     Comments: No increased work of breathing.  Speaks in full sentences without difficulty. Abdominal:     Palpations: Abdomen is soft.     Tenderness: There is abdominal tenderness in the right lower quadrant, suprapubic area and left lower quadrant. There is no guarding.       Comments: Tenderness across the lower abdomen, worse towards the left of the abdomen.  Musculoskeletal:     Cervical back: Neck supple.     Right lower leg: No edema.     Left lower leg: No edema.  Lymphadenopathy:     Cervical: No cervical adenopathy.  Skin:    General: Skin is warm and dry.  Neurological:     Mental Status: She is alert.  Psychiatric:        Mood and Affect: Mood and affect normal.        Speech: Speech normal.        Behavior: Behavior normal.     ED Results / Procedures / Treatments   Labs (all labs ordered are listed, but only abnormal results are displayed) Labs Reviewed  CBC - Abnormal; Notable for the following components:      Result Value   Platelets 130 (*)    All  other components within normal limits  HCG, QUANTITATIVE, PREGNANCY - Abnormal; Notable for the following components:   hCG, Beta Chain, Quant, S 47,354 (*)    All other components within normal limits  URINALYSIS, ROUTINE W REFLEX MICROSCOPIC - Abnormal; Notable for the following components:   Ketones, ur 80 (*)    All other components within normal limits  D-DIMER, QUANTITATIVE (NOT AT [facility]) - Abnormal; Notable  for the following components:   D-Dimer, Quant 0.88 (*)    All other components within normal limits  I-STAT BETA HCG BLOOD, ED ([facility], [facility], [facility] ONLY) - Abnormal; Notable for the following components:   I-stat hCG, quantitative >2,000.0 (*)    All other components within normal limits  URINE CULTURE  SARS CORONAVIRUS 2 BY RT PCR (HOSPITAL ORDER, PERFORMED IN [facility])  BASIC METABOLIC PANEL  HEPATIC FUNCTION PANEL  TROPONIN I (HIGH SENSITIVITY)  TROPONIN I (HIGH SENSITIVITY)    EKG EKG Interpretation  Date/Time:  Monday June 25 2019 15:19:21 EDT Ventricular Rate:  83 PR Interval:  156 QRS Duration: 86 QT Interval:  352 QTC Calculation: 413 R Axis:   79 Text Interpretation: Normal sinus rhythm with sinus arrhythmia Normal ECG No old tracing to compare Confirmed by Jacalyn Lefevre [phone]) on 06/25/2019 10:46:35 PM   Radiology DG Chest 2 View  Result Date: 06/25/2019 CLINICAL DATA:  Chest pain, abdominal cramping EXAM: CHEST - 2 VIEW COMPARISON:  04/02/2015 FINDINGS: The heart size and mediastinal contours are within normal limits. Both lungs are clear. The visualized skeletal structures are unremarkable. IMPRESSION: No active cardiopulmonary disease. Electronically Signed   By: Sharlet Salina M.D.   On: 06/25/2019 17:18   US OB Comp < 14 Wks  Result Date: 06/25/2019 CLINICAL DATA:  Left lower quadrant pain. History of ectopic pregnancy. EXAM: OBSTETRIC <14 WK ULTRASOUND TECHNIQUE: Transabdominal ultrasound was performed for evaluation of the gestation as well as  the maternal uterus and adnexal regions. COMPARISON:  None. 05/06/2019 FINDINGS: Intrauterine gestational sac: Single Yolk sac:  Visualized. Embryo:  Visualized. Cardiac Activity: Visualized. Heart Rate: 110 bpm CRL:   3.8 mm   6 w 0 d                  [place] EDC: Subchorionic hemorrhage:  None visualized. Maternal uterus/adnexae: The right ovary measures 3.6 x 1.8 x 1.7 cm. The left ovary measures 4.3 x 3.3 x 3.4 cm. There is a left ovarian dominant follicle. There is no significant free fluid the patient's pelvis. IMPRESSION: Single live IUP at 6 weeks as detailed above. Electronically Signed   By: Katherine Mantle M.D.   On: 06/25/2019 23:57    Procedures Ultrasound ED Peripheral IV (Provider)  Date/Time: 06/25/2019 10:50 PM Performed by: Anselm Pancoast, PA-C Authorized by: Anselm Pancoast, PA-C   Procedure details:    Indications: poor IV access     Skin Prep: chlorhexidine gluconate     Location:  Left AC   Angiocath:  20 G   Bedside Ultrasound Guided: Yes     Images: not archived     Patient tolerated procedure without complications: Yes     Dressing applied: Yes   Comments:     Positive flash.  Advanced and flushed without pain, swelling, or other signs of infiltration.   (including critical care time)  Medications Ordered in ED Medications  sodium chloride flush (NS) 0.9 % injection 3 mL (has no administration in time range)  acetaminophen (TYLENOL) tablet 1,000 mg (has no administration in time range)  ondansetron (ZOFRAN) injection 4 mg (4 mg Intravenous Given 06/26/19 0025)  sodium chloride 0.9 % bolus 1,000 mL (1,000 mLs Intravenous New Bag/Given 06/26/19 0024)    ED Course  I have reviewed the triage vital signs and the nursing notes.  Pertinent labs & imaging results that were available during my care of the patient were reviewed by me and considered in  my medical decision making (see chart for details).    MDM Rules/Calculators/A&P                      Patient presents with  abdominal pain in the setting of pregnancy. Patient is nontoxic appearing, afebrile, not tachycardic, not tachypneic, not hypotensive, maintains excellent SPO2 on room air, and is in no apparent distress.   I have reviewed the patient's chart to obtain more information.  I reviewed and interpreted the patient's labs and radiological studies. Ultrasound shows IUP.  Patient also complains of chest pain and shortness of breath.  Troponin negative.  EKG without ischemic changes.  Chest x-ray unremarkable. D-dimer mildly elevated.  CT PE study without acute abnormalities.   Due to the patient's persistent and recurring abdominal pain and multiple evaluations by multiple providers, all indicating tenderness to the abdomen, order was placed for abdominal and pelvic MRI.   End of shift patient care handoff report given to Army Melia, PA-C. Plan: Follow-up on MRI and reexamine patient.   Findings and plan of care discussed with Dione Booze, MD. Dr. Preston Fleeting personally evaluated and examined this patient.  Final Clinical Impression(s) / ED Diagnoses Final diagnoses:  None    Rx / DC Orders ED Discharge Orders    None       Concepcion Living 06/26/19 0715    Dione Booze, MD 06/26/19 [phone]
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
## 2019-06-25 NOTE — ED Notes (Signed)
Patient is being discharged from the Urgent Care Center and sent to the Emergency Department via wheelchair by staff. Per [employer], PA-C, patient is stable but in need of higher level of care due to pregnancy with abd pain. Patient is aware and verbalizes understanding of plan of care.  Vitals:   06/25/19 1404  BP: 121/83  Pulse: (!) 102  Resp: 18  Temp: 99.2 F (37.3 C)  SpO2: 99%

## 2019-06-25 NOTE — ED Provider Notes (Signed)
[facility]    CSN: [id number] Arrival date & time: 06/25/19  1339      History   Chief Complaint Chief Complaint  Patient presents with   Fatigue    HPI Sally Haas is a 24 y.o. female history of recent ectopic pregnancy presenting today for evaluation of multiple complaints including abdominal pain, chest pain, lightheadedness.  Patient reports since last Wednesday she has had a pressure sensation in her chest which has been persistent, will wax and wane at times.  Denies any specific trigger.  Denies associated cough, but does feel difficult to take a deep breath at times.  Denies URI symptoms.  Denies fevers.  Has had some nausea as well as some lower abdominal pain.  Patient had ectopic pregnancy rupture in April and had subsequent right salpingectomy.  Her abdominal pain today is new since surgery and does report being sexually active with her husband since.  Denies concerns for STDs.  Denies any abnormal bleeding, abnormal discharge.  Denies urinary symptoms.  Last menstrual cycle was 3/25, before ectopic.  Was approximately 6 weeks at the time  HPI  Past Medical History:  Diagnosis Date   Thrombocytopenia [facility])     Patient Active Problem List   Diagnosis Date Noted   Rh negative state in antepartum period 05/07/2019   Hemoperitoneum due to rupture of right tubal ectopic pregnancy    Abdominal pain in pregnancy 05/06/2019   Pregnancy of unknown anatomic location 05/06/2019   Thrombocytopenia ([facility]) 05/06/2019    Past Surgical History:  Procedure Laterality Date   DIAGNOSTIC LAPAROSCOPY WITH REMOVAL OF ECTOPIC PREGNANCY Right 05/07/2019   Procedure: Laparoscopic Removal Of Ectopic Pregnancy;  Surgeon: Reva Bores, MD;  Location: [facility] OR;  Service: Gynecology;  Laterality: Right;   LAPAROSCOPIC CHOLECYSTECTOMY      OB History    Gravida  3   Para  1   Term  1   Preterm      AB  1   Living  1     SAB  1   TAB      Ectopic       Multiple      Live Births  1        Obstetric Comments  G1: early SAB G2: TSVD 2017          Home Medications    Prior to Admission medications   Medication Sig Start Date End Date Taking? Authorizing Provider  aspirin-acetaminophen-caffeine (EXCEDRIN MIGRAINE) [phone] MG tablet Take 1 tablet by mouth every 6 (six) hours as needed for headache.    [provider]  lamoTRIgine (LAMICTAL) 25 MG tablet Take 1 tablet (25 mg total) by mouth daily. Take one tablet daily for a week and then start taking 2 tablets. 06/25/19   Thresa Ross, MD    Family History Family History  Problem Relation Age of Onset   Alcohol abuse Father     Social History Social History   Tobacco Use   Smoking status: Never Smoker   Smokeless tobacco: Never Used  Substance Use Topics   Alcohol use: Not on file   Drug use: Not on file     Allergies   Patient has no known allergies.   Review of Systems Review of Systems  Constitutional: Negative for fever.  Respiratory: Positive for shortness of breath.   Cardiovascular: Positive for chest pain.  Gastrointestinal: Positive for abdominal pain. Negative for diarrhea, nausea and vomiting.  Genitourinary: Negative for dysuria,  flank pain, genital sores, hematuria, menstrual problem, vaginal bleeding, vaginal discharge and vaginal pain.  Musculoskeletal: Negative for back pain.  Skin: Negative for rash.  Neurological: Positive for light-headedness. Negative for dizziness and headaches.     Physical Exam Triage Vital Signs ED Triage Vitals  Enc Vitals Group     BP 06/25/19 1404 121/83     Pulse Rate 06/25/19 1404 (!) 102     Resp 06/25/19 1404 18     Temp 06/25/19 1404 99.2 F (37.3 C)     Temp src --      SpO2 06/25/19 1404 99 %     Weight --      Height --      Head Circumference --      Peak Flow --      Pain Score 06/25/19 1406 5     Pain Loc --      Pain Edu? --      Excl. in [place]? --    No data  found.  Updated Vital Signs BP 121/83    Pulse (!) 102    Temp 99.2 F (37.3 C)    Resp 18    LMP  (LMP Unknown)    SpO2 99%   Visual Acuity Right Eye Distance:   Left Eye Distance:   Bilateral Distance:    Right Eye Near:   Left Eye Near:    Bilateral Near:     Physical Exam Vitals and nursing note reviewed.  Constitutional:      Appearance: She is well-developed.     Comments: No acute distress  HENT:     Head: Normocephalic and atraumatic.     Nose: Nose normal.  Eyes:     Conjunctiva/sclera: Conjunctivae normal.  Cardiovascular:     Rate and Rhythm: Normal rate.  Pulmonary:     Effort: Pulmonary effort is normal. No respiratory distress.     Comments: Breathing comfortably at rest, CTABL, no wheezing, rales or other adventitious sounds auscultated Abdominal:     General: There is no distension.     Comments: Soft, nondistended, tender to palpation to mid lower abdomen and suprapubic area, umbilicus appears erythematous with pustular discharge noted within umbilicus  Musculoskeletal:        General: Normal range of motion.     Cervical back: Neck supple.  Skin:    General: Skin is warm and dry.  Neurological:     Mental Status: She is alert and oriented to person, place, and time.      UC Treatments / Results  Labs (all labs ordered are listed, but only abnormal results are displayed) Labs Reviewed  POC URINE PREG, ED - Abnormal; Notable for the following components:      Result Value   Preg Test, Ur POSITIVE (*)    All other components within normal limits  POCT URINALYSIS DIP (DEVICE) - Abnormal; Notable for the following components:   Ketones, ur TRACE (*)    Leukocytes,Ua SMALL (*)    All other components within normal limits    EKG   Radiology No results found.  Procedures Procedures (including critical care time)  Medications Ordered in UC Medications - No data to display  Initial Impression / Assessment and Plan / UC Course  I have  reviewed the triage vital signs and the nursing notes.  Pertinent labs & imaging results that were available during my care of the patient were reviewed by me and considered in my medical decision making (see  chart for details).  Clinical Course as of Jun 25 1455  Mon Jun 25, 2019  1447 HR rechecked 98-99   [HW]    Clinical Course User Index [HW] Alizza Sacra, Sherman C, [place]    Patient with continued positive pregnancy test with associated abdominal pain.  Given ectopic greater than 1 month ago and was only 6 weeks, would expect this to be negative at this time.  Given associated symptoms recommending follow-up in emergency room for further evaluation to ensure not repeat ectopic. Stable on discharge.  Discussed strict return precautions. Patient verbalized understanding and is agreeable with plan.  Final Clinical Impressions(s) / UC Diagnoses   Final diagnoses:  Lower abdominal pain  Positive pregnancy test     Discharge Instructions     Please go to the emergency room for further evaluation of possible ectopic   ED Prescriptions    None     PDMP not reviewed this encounter.   Justine Cossin, Hannah C, PA-C 06/25/19 1500

## 2019-06-25 NOTE — Progress Notes (Addendum)
Psychiatric Initial Adult Assessment   Patient Identification: Sally Haas MRN:  [id number] Date of Evaluation:  06/25/2019 Referral Source: primary care  Chief Complaint:  mood symptoms Visit Diagnosis:    ICD-10-CM   1. Bipolar 2 disorder (HCC)  F31.81   2. GAD (generalized anxiety disorder)  F41.1    I connected with Sally Haas on 06/25/19 at  9:00 AM EDT by a video enabled telemedicine application and verified that I am speaking with the correct person using two identifiers.   I discussed the limitations of evaluation and management by telemedicine and the availability of in person appointments. The patient expressed understanding and agreed to proceed.  Patient location: home Provider location: home  History of Present Illness:  24 years old married white female referred for mood symptoms  She works Production designer, theatre/television/film at [employer], has 19 years old daughter  Has been having days of highs and then lows. Started later in high school but now getting worse and effecting job Endorses high for days, increased energy, elated mood, distracted, starts project and has done irrational things including Scientist, research (medical). Then crashes into depression, sad, crying spells, excessive worries, tiredness and withdrawn for days and feels not wanting to go to work and that has effected job  Excessive worries more so during down time, tiredness   No psychotic symptoms.  Denies prior admission Has tried to choke herself when 24 years of age going thru difficult time growing up Poor and moving a lot with parents and they got later divorced. No admission or prior medications  Aggravating factor: difficult childhood, husband was not working, difficult postpartum Modifying factor; daughter, animals, pets  Duration since high school  Drug use denies   Associated Signs/Symptoms:   Past Psychiatric History: mood symptoms, depression  Previous Psychotropic Medications: No   Substance Abuse  History in the last 12 months:  No.  Consequences of Substance Abuse: NA  Past Medical History:  Past Medical History:  Diagnosis Date  . Thrombocytopenia (HCC)     Past Surgical History:  Procedure Laterality Date  . DIAGNOSTIC LAPAROSCOPY WITH REMOVAL OF ECTOPIC PREGNANCY Right 05/07/2019   Procedure: Laparoscopic Removal Of Ectopic Pregnancy;  Surgeon: Reva Bores, MD;  Location: [facility];  Service: Gynecology;  Laterality: Right;  . LAPAROSCOPIC CHOLECYSTECTOMY      Family Psychiatric History: says dad was unstable , mom had mood symptoms but no diagnosis   Family History:  Family History  Problem Relation Age of Onset  . Alcohol abuse Father     Social History:   Social History   Socioeconomic History  . Marital status: Married    Spouse name: Not on file  . Number of children: Not on file  . Years of education: Not on file  . Highest education level: Not on file  Occupational History  . Not on file  Tobacco Use  . Smoking status: Never Smoker  . Smokeless tobacco: Never Used  Substance and Sexual Activity  . Alcohol use: Not on file  . Drug use: Not on file  . Sexual activity: Not on file  Other Topics Concern  . Not on file  Social History Narrative  . Not on file   Social Determinants of Health   Financial Resource Strain:   . Difficulty of Paying Living Expenses:   Food Insecurity: No Food Insecurity  . Worried About Programme researcher, broadcasting/film/video in the Last Year: Never true  . Ran Out of Food in the Last  Year: Never true  Transportation Needs: No Transportation Needs  . Lack of Transportation (Medical): No  . Lack of Transportation (Non-Medical): No  Physical Activity:   . Days of Exercise per Week:   . Minutes of Exercise per Session:   Stress:   . Feeling of Stress :   Social Connections:   . Frequency of Communication with Friends and Family:   . Frequency of Social Gatherings with Friends and Family:   . Attends Religious Services:   . Active  Member of Clubs or Organizations:   . Attends Archivist Meetings:   Marland Kitchen Marital Status:     Additional Social History: grew up with parents, they moved a lot, were poor, family helped them grow up Half siblings. Difficult relationship with mom  Allergies:  No Known Allergies  Metabolic Disorder Labs: No results found for: HGBA1C, MPG No results found for: PROLACTIN No results found for: CHOL, TRIG, HDL, CHOLHDL, VLDL, LDLCALC No results found for: TSH  Therapeutic Level Labs: No results found for: LITHIUM No results found for: CBMZ No results found for: VALPROATE  Current Medications: Current Outpatient Medications  Medication Sig Dispense Refill  . aspirin-acetaminophen-caffeine (EXCEDRIN MIGRAINE) 250-250-65 MG tablet Take 1 tablet by mouth every 6 (six) hours as needed for headache.    . ibuprofen (ADVIL) 600 MG tablet Take 1 tablet (600 mg total) by mouth every 6 (six) hours as needed. (Patient not taking: Reported on 05/31/2019) 30 tablet 0  . lamoTRIgine (LAMICTAL) 25 MG tablet Take 1 tablet (25 mg total) by mouth daily. Take one tablet daily for a week and then start taking 2 tablets. 60 tablet 0  . ondansetron (ZOFRAN ODT) 8 MG disintegrating tablet 8mg  ODT q4 hours prn nausea (Patient not taking: Reported on 05/31/2019) 12 tablet 0  . oxyCODONE (OXY IR/ROXICODONE) 5 MG immediate release tablet Take 1 tablet (5 mg total) by mouth every 6 (six) hours as needed for severe pain. (Patient not taking: Reported on 05/31/2019) 8 tablet 0  . promethazine (PHENERGAN) 25 MG tablet Take 1 tablet (25 mg total) by mouth every 6 (six) hours as needed for nausea or vomiting. (Patient not taking: Reported on 05/31/2019) 20 tablet 0   No current facility-administered medications for this visit.    Musculoskeletal:   Psychiatric Specialty Exam: Review of Systems  Cardiovascular: Negative for chest pain.  Skin: Negative for rash.  Psychiatric/Behavioral: Negative for agitation,  behavioral problems and hallucinations.    There were no vitals taken for this visit.There is no height or weight on file to calculate BMI.  General Appearance: Casual  Eye Contact:  Fair  Speech:  Normal Rate  Volume:  Decreased  Mood:  subdued  Affect:  Congruent  Thought Process:  Goal Directed  Orientation:  Full (Time, Place, and Person)  Thought Content:  Rumination  Suicidal Thoughts:  No  Homicidal Thoughts:  No  Memory:  Immediate;   Fair Recent;   Fair  Judgement:  Fair  Insight:  Shallow  Psychomotor Activity:  Normal  Concentration:  Concentration: Fair and Attention Span: Fair  Recall:  [employer] of Knowledge:Good  Language: Good  Akathisia:  No  Handed:    AIMS (if indicated):  not done  Assets:  Desire for Improvement Financial Resources/Insurance Leisure Time Physical Health  ADL's:  Intact  Cognition: WNL  Sleep:  Fair   Screenings: GAD-7     Office Visit from 05/31/2019 in [place] for [employer] at [facility] Erroneous Encounter from 05/10/2019 in Center for [facility]  Total GAD-7 Score  3  2    PHQ2-9     Office Visit from 05/31/2019 in Center for [facility] at [facility] Erroneous Encounter from 05/10/2019 in Center for [facility]  PHQ-2 Total Score  2  0  PHQ-9 Total Score  11  0      Assessment and Plan: as follows  Bipolar 2: current episode unspecified or depressed; start lamictal increase to 50mg  in one week, rash explained GAD: will start lamictal for now and refer to therapy, considering SSRI once mood stabilizer on right dose Call earlier for concerns    I discussed the assessment and treatment plan with the patient. The patient was provided an opportunity to ask questions and all were answered. The patient agreed with the plan and demonstrated an understanding of the instructions.   The patient was advised to call back or seek an in-person  evaluation if the symptoms worsen or if the condition fails to improve as anticipated. Fu 3 weeks or earlier, also schedule therapy I provided 45 minutes of non-face-to-face time during this encounter.  , MD 6/7/20219:27 AM

## 2019-06-25 NOTE — Discharge Instructions (Addendum)
Please go to the emergency room for further evaluation of possible ectopic

## 2019-06-25 NOTE — ED Triage Notes (Addendum)
Pt c/o fatigue, headache, abdominal cramping, diarrhea and chest pain. Pt had recent ectopic pregnancy in April and has not had period since, concerned she is pregnant

## 2019-06-26 ENCOUNTER — Emergency Department (HOSPITAL_COMMUNITY): Payer: PRIVATE HEALTH INSURANCE

## 2019-06-26 ENCOUNTER — Ambulatory Visit (INDEPENDENT_AMBULATORY_CARE_PROVIDER_SITE_OTHER): Payer: PRIVATE HEALTH INSURANCE | Admitting: Clinical

## 2019-06-26 ENCOUNTER — Telehealth (INDEPENDENT_AMBULATORY_CARE_PROVIDER_SITE_OTHER): Payer: PRIVATE HEALTH INSURANCE

## 2019-06-26 DIAGNOSIS — O26891 Other specified pregnancy related conditions, first trimester: Secondary | ICD-10-CM

## 2019-06-26 DIAGNOSIS — F411 Generalized anxiety disorder: Secondary | ICD-10-CM | POA: Diagnosis not present

## 2019-06-26 DIAGNOSIS — F3181 Bipolar II disorder: Secondary | ICD-10-CM

## 2019-06-26 DIAGNOSIS — R0789 Other chest pain: Secondary | ICD-10-CM

## 2019-06-26 DIAGNOSIS — R1030 Lower abdominal pain, unspecified: Secondary | ICD-10-CM

## 2019-06-26 LAB — URINALYSIS, ROUTINE W REFLEX MICROSCOPIC
Bilirubin Urine: NEGATIVE
Glucose, UA: NEGATIVE mg/dL
Hgb urine dipstick: NEGATIVE
Ketones, ur: 80 mg/dL — AB
Leukocytes,Ua: NEGATIVE
Nitrite: NEGATIVE
Protein, ur: NEGATIVE mg/dL
Specific Gravity, Urine: 1.017 (ref 1.005–1.030)
pH: 6 (ref 5.0–8.0)

## 2019-06-26 LAB — HEPATIC FUNCTION PANEL
ALT: 14 U/L (ref 0–44)
AST: 18 U/L (ref 15–41)
Albumin: 4.1 g/dL (ref 3.5–5.0)
Alkaline Phosphatase: 45 U/L (ref 38–126)
Bilirubin, Direct: 0.2 mg/dL (ref 0.0–0.2)
Indirect Bilirubin: 0.9 mg/dL (ref 0.3–0.9)
Total Bilirubin: 1.1 mg/dL (ref 0.3–1.2)
Total Protein: 7 g/dL (ref 6.5–8.1)

## 2019-06-26 LAB — URINE CULTURE: Culture: <10000 — AB

## 2019-06-26 LAB — HCG, QUANTITATIVE, PREGNANCY: hCG, Beta Chain, Quant, S: 47354 m[IU]/mL — ABNORMAL HIGH (ref <5)

## 2019-06-26 LAB — D-DIMER, QUANTITATIVE: D-Dimer, Quant: 0.88 ug/mL-FEU — ABNORMAL HIGH (ref 0.00–0.50)

## 2019-06-26 LAB — TROPONIN I (HIGH SENSITIVITY): Troponin I (High Sensitivity): <2 ng/L (ref <18)

## 2019-06-26 MED ORDER — SODIUM CHLORIDE 0.9 % IV BOLUS: 1000.0000 mL | Freq: Once | INTRAVENOUS | Status: DC

## 2019-06-26 MED ORDER — ACETAMINOPHEN 500 MG PO TABS
1000.0000 mg | ORAL_TABLET | Freq: Once | ORAL | Status: AC
  Administered 2019-06-26: 1000 mg via ORAL
  Filled 2019-06-26: qty 2

## 2019-06-26 MED ORDER — IOHEXOL 350 MG/ML SOLN
100.0000 mL | Freq: Once | INTRAVENOUS | Status: AC | PRN
  Administered 2019-06-26: 100 mL via INTRAVENOUS

## 2019-06-26 NOTE — Telephone Encounter (Addendum)
-----   Message from Rae Lips, [place] sent at 06/26/2019  2:35 PM EDT ----- Pt with recent ectopic (April), now estimated 6 wk positive pregnancy, per ED.   Once positive pregnancy, ED did not test further as "they were super busy with a lot of trauma injuries, and needed beds". She went in for chest pain since Wednesday, with chills, dizziness, fatigue, shallow breathing, and when "hooked to machine", irregular heart rate and kept saying "apnea". It occurred to her that nobody tested her for covid. Is this something she should get done prior to new ob visit in two weeks? Pt would appreciate any advice.     Called pt to follow up on above message from [facility]. Information given for pt to schedule COVID test. Pt reports continued chest pain. Reports all tests done in ED were negative. States she was given Zofran in ED but nothing for acid reflux. Suggested pt try OTC Pepcid with Tums or Maalox as needed. Pt reports having increased chest pain after waking up. Also reports worsening diarrhea, states it has become yellow. Pt reports history of cholecystectomy. Pt has new OB intake on 07/16/19. Explained to pt I will review her recent history with a provider tomorrow and call to follow up.

## 2019-06-26 NOTE — BH Specialist Note (Signed)
Integrated Behavioral Health via Telemedicine Video Visit  06/26/2019 [name] [id number]  Number of [place] visits: 2 Session Start time: 1:26  Session End time: 2:10 Total time: 27  Referring Provider: Darron Doom, MD Type of Visit: Video Patient/Family location: Home [facility] Provider location: Center for [place] [facility]  All persons participating in visit: [name] and Sally Haas    Confirmed patient's address: Yes  Confirmed patient's phone number: Yes  Any changes to demographics: No   Confirmed patient's insurance: Yes  Any changes to patient's insurance: No   Discussed confidentiality: at previous visit  I connected with Sally (Sally Haas) and verified that I am speaking with the correct person using two identifiers.     I discussed the limitations of evaluation and management by telemedicine and the availability of in person appointments.  I discussed that the purpose of this visit is to provide behavioral health care while limiting exposure to the novel coronavirus.   Discussed there is a possibility of technology failure and discussed alternative modes of communication if that failure occurs.  I discussed that engaging in this video visit, they consent to the provision of behavioral healthcare and the services will be billed under their insurance.  Patient and/or legal guardian expressed understanding and consented to video visit: Yes   PRESENTING CONCERNS: Patient and/or family reports the following symptoms/concerns: Pt states she is currently seeing a psychiatrist, began taking Lamictal yesterday; increase in anxiety, depression, and job stress; primary concern is chest hurting since Wednesday, with chills, dizziness, fatigue, and irregular heart rate when sleeping;pt found out positive pregnancy; was not given covid test in ED.  Duration of problem: Current  pregnancy (estimated at 6 weeks); Severity of problem: severe  STRENGTHS (Protective Factors/Coping Skills): Adhering to treatment via psychiatry and [place] med management, supportive husband; self-aware  GOALS ADDRESSED: Patient will: 1.  Reduce symptoms of: anxiety, depression, mood instability and stress  2.  Demonstrate ability to: Increase healthy adjustment to current life circumstances and Increase motivation to adhere to plan of care  INTERVENTIONS: Interventions utilized:  Supportive Counseling Standardized Assessments completed: GAD-7 and PHQ 9  ASSESSMENT: Patient currently experiencing Bipolar 2 disorder and Generalized anxiety disorder (both as previously diagnosed via psychiatry)  Patient may benefit from continued psychoeducation and brief therapeutic interventions regarding coping with symptoms of anxiety, depression, and life stress .  PLAN: 1. Follow up with behavioral health clinician on : six weeks 2. Behavioral recommendations:  -Continue plan to attend upcoming psychiatry appointment on 07/19/19 -Continue taking [place] medication, prescribed by psychiatry -Nurse/clinical staff will call with any further recommendations for medical symptoms -Prioritize healthy sleep and self-care, as discussed, prior to new ob intake visit on 07/16/19 3. Referral(s): [place] (In Clinic)  I discussed the assessment and treatment plan with the patient and/or parent/guardian. They were provided an opportunity to ask questions and all were answered. They agreed with the plan and demonstrated an understanding of the instructions.   They were advised to call back or seek an in-person evaluation if the symptoms worsen or if the condition fails to improve as anticipated.  Sally Haas  Depression screen [facility] 2/9 06/26/2019 05/31/2019 05/10/2019  Decreased Interest 3 1 0  Down, Depressed, Hopeless 3 1 0  PHQ - 2 Score 6 2 0  Altered sleeping 3 2 0  Tired,  decreased energy 3 3 0  Change in appetite 3 3  0  Feeling bad or failure about yourself  3 1 0  Trouble concentrating 3 0 0  Moving slowly or fidgety/restless 1 0 0  Suicidal thoughts 0 0 0  PHQ-9 Score 22 11 0  Difficult doing work/chores - - Not difficult at all   GAD 7 : Generalized Anxiety Score 06/26/2019 05/31/2019 05/10/2019  Nervous, Anxious, on Edge 3 2 1   Control/stop worrying 3 1 0  Worry too much - different things 3 0 0  Trouble relaxing 3 0 0  Restless 3 0 0  Easily annoyed or irritable 3 0 1  Afraid - awful might happen 3 0 0  Total GAD 7 Score 21 3 2

## 2019-06-26 NOTE — Discharge Instructions (Addendum)
Follow up with OB for further evaluation and care.

## 2019-06-26 NOTE — ED Provider Notes (Signed)
24yo female with lower abdominal pain for a few days, found to be [redacted] weeks pregnant (IUP today). Had ectopic pregnancy in April.  Awaiting MRI, if no acute findings, follow up with OB.  Negative PE study, negative troponin for CP work up.  Physical Exam  BP 101/63   Pulse 70   Temp 98.8 F (37.1 C) (Oral)   Resp 17   Ht 5\' 5"  (1.651 m)   Wt 85 kg   LMP  (LMP Unknown)   SpO2 98%   BMI 31.18 kg/m   Physical Exam Alert, no distress, respirations even and unlabored. No reports of pain. ED Course/Procedures     Procedures  MDM  Reviewed results with patient, workup today shows IUP with HR 110, MRI negative for appendicitis or other abdominal pathology.  CT chest negative for PE.  Lab work negative for ischemic changes, low risk for ACS.  Recommend follow-up with patient's OB, given referral to outpatient women's clinic if needed.  Return to ER for worsening or concerning symptoms.      , PA-C 06/26/19 08/26/19    [id number], MD 06/26/19 [phone]

## 2019-06-26 NOTE — ED Notes (Signed)
Pt to MRI

## 2019-07-01 ENCOUNTER — Other Ambulatory Visit: Payer: Self-pay | Admitting: Family Medicine

## 2019-07-01 DIAGNOSIS — O219 Vomiting of pregnancy, unspecified: Secondary | ICD-10-CM

## 2019-07-01 MED ORDER — DOXYLAMINE-PYRIDOXINE 10-10 MG PO TBEC: 2.0000 | DELAYED_RELEASE_TABLET | Freq: Every day | ORAL | 5 refills | Status: DC

## 2019-07-01 MED ORDER — ONDANSETRON 4 MG PO TBDP: 4.0000 mg | ORAL_TABLET | Freq: Four times a day (QID) | ORAL | 0 refills | Status: DC | PRN

## 2019-07-01 NOTE — Progress Notes (Signed)
Patient called L&D main number. She has not yet established care with [place] but reports a visit in early July. Reports she is [redacted] weeks pregnant and has been experiencing a lot of nausea and vomiting. She was able to eat and drink yesterday, but has been unable to keep anything down today. Denies fever, dysuria, vaginal bleeding, cramping, abdominal pain, diarrhea. Discussed that at any time she is welcome to go to MAU. As she was able to eat and drink yesterday, okay to manage at home which patient was very agreeable to. Diclegis and Zofran sent to pharmacy. Confirmed no allergies. Currently taking Lamictal for bipolar disorder. Precautions given to go to MAU.  Jerilynn Birkenhead, MD [facility] Family Medicine Fellow, [facility] for [employer], [facility]

## 2019-07-06 ENCOUNTER — Encounter (HOSPITAL_COMMUNITY): Payer: Self-pay | Admitting: Obstetrics and Gynecology

## 2019-07-06 ENCOUNTER — Inpatient Hospital Stay (HOSPITAL_COMMUNITY)
Admission: AD | Admit: 2019-07-06 | Discharge: 2019-07-06 | Disposition: A | Discharge status: Home / Self Care | Payer: PRIVATE HEALTH INSURANCE | Attending: Obstetrics and Gynecology | Admitting: Obstetrics and Gynecology

## 2019-07-06 ENCOUNTER — Other Ambulatory Visit: Payer: Self-pay

## 2019-07-06 DIAGNOSIS — O210 Mild hyperemesis gravidarum: Secondary | ICD-10-CM | POA: Insufficient documentation

## 2019-07-06 DIAGNOSIS — O219 Vomiting of pregnancy, unspecified: Secondary | ICD-10-CM | POA: Diagnosis not present

## 2019-07-06 DIAGNOSIS — Z7982 Long term (current) use of aspirin: Secondary | ICD-10-CM | POA: Insufficient documentation

## 2019-07-06 DIAGNOSIS — Z3A01 Less than 8 weeks gestation of pregnancy: Secondary | ICD-10-CM | POA: Diagnosis not present

## 2019-07-06 DIAGNOSIS — Z79899 Other long term (current) drug therapy: Secondary | ICD-10-CM | POA: Insufficient documentation

## 2019-07-06 DIAGNOSIS — Z8759 Personal history of other complications of pregnancy, childbirth and the puerperium: Secondary | ICD-10-CM | POA: Insufficient documentation

## 2019-07-06 LAB — URINALYSIS, ROUTINE W REFLEX MICROSCOPIC
Bilirubin Urine: NEGATIVE
Glucose, UA: NEGATIVE mg/dL
Hgb urine dipstick: NEGATIVE
Ketones, ur: 80 mg/dL — AB
Leukocytes,Ua: NEGATIVE
Nitrite: NEGATIVE
Protein, ur: 30 mg/dL — AB
Specific Gravity, Urine: 1.03 (ref 1.005–1.030)
pH: 5 (ref 5.0–8.0)

## 2019-07-06 MED ORDER — FAMOTIDINE IN NACL 20-0.9 MG/50ML-% IV SOLN
20.0000 mg | Freq: Once | INTRAVENOUS | Status: AC
  Administered 2019-07-06: 20 mg via INTRAVENOUS
  Filled 2019-07-06: qty 50

## 2019-07-06 MED ORDER — METOCLOPRAMIDE HCL 5 MG/ML IJ SOLN
10.0000 mg | Freq: Once | INTRAMUSCULAR | Status: AC
  Administered 2019-07-06: 10 mg via INTRAVENOUS
  Filled 2019-07-06: qty 2

## 2019-07-06 MED ORDER — PROMETHAZINE HCL 25 MG PO TABS: 12.5000 mg | ORAL_TABLET | Freq: Four times a day (QID) | ORAL | 2 refills | Status: DC | PRN

## 2019-07-06 MED ORDER — METOCLOPRAMIDE HCL 10 MG PO TABS
10.0000 mg | ORAL_TABLET | Freq: Three times a day (TID) | ORAL | 3 refills | Status: DC | PRN
Start: 2019-07-06 — End: 2021-07-08

## 2019-07-06 MED ORDER — PROMETHAZINE HCL 25 MG/ML IJ SOLN
12.5000 mg | Freq: Once | INTRAMUSCULAR | Status: AC
  Administered 2019-07-06: 12.5 mg via INTRAVENOUS
  Filled 2019-07-06: qty 1

## 2019-07-06 MED ORDER — LACTATED RINGERS IV BOLUS
1000.0000 mL | Freq: Once | INTRAVENOUS | Status: AC
  Administered 2019-07-06: 1000 mL via INTRAVENOUS

## 2019-07-06 NOTE — MAU Provider Note (Signed)
Chief Complaint: Morning Sickness   First Provider Initiated Contact with Patient 07/06/19 1831      SUBJECTIVE HPI: Sally Haas is a 24 y.o. [id number] at [redacted]w[redacted]d with IUP by early ultrasound who presents to maternity admissions reporting continuous nausea with vomiting 3-4 times daily. She has been unable to keep anything down x 3-4 days.  She works at [employer] so the smells of food preparation have been making her symptoms worse.  She was prescribed Diclegis but it is $500 with her insurance.  There are no other associated symptoms.  She has not tried any other treatments.   HPI  Past Medical History:  Diagnosis Date  . Thrombocytopenia (HCC)    Past Surgical History:  Procedure Laterality Date  . DIAGNOSTIC LAPAROSCOPY WITH REMOVAL OF ECTOPIC PREGNANCY Right 05/07/2019   Procedure: Laparoscopic Removal Of Ectopic Pregnancy;  Surgeon: Reva Bores, MD;  Location: [facility] OR;  Service: Gynecology;  Laterality: Right;  . LAPAROSCOPIC CHOLECYSTECTOMY     Social History   Socioeconomic History  . Marital status: Married    Spouse name: Not on file  . Number of children: Not on file  . Years of education: Not on file  . Highest education level: Not on file  Occupational History  . Not on file  Tobacco Use  . Smoking status: Never Smoker  . Smokeless tobacco: Never Used  Vaping Use  . Vaping Use: Never used  Substance and Sexual Activity  . Alcohol use: Never  . Drug use: Never  . Sexual activity: Yes  Other Topics Concern  . Not on file  Social History Narrative  . Not on file   Social Determinants of Health   Financial Resource Strain:   . Difficulty of Paying Living Expenses:   Food Insecurity: No Food Insecurity  . Worried About Programme researcher, broadcasting/film/video in the Last Year: Never true  . Ran Out of Food in the Last Year: Never true  Transportation Needs: No Transportation Needs  . Lack of Transportation (Medical): No  . Lack of Transportation (Non-Medical): No   Physical Activity:   . Days of Exercise per Week:   . Minutes of Exercise per Session:   Stress:   . Feeling of Stress :   Social Connections:   . Frequency of Communication with Friends and Family:   . Frequency of Social Gatherings with Friends and Family:   . Attends Religious Services:   . Active Member of Clubs or Organizations:   . Attends Banker Meetings:   Marland Kitchen Marital Status:   Intimate Partner Violence: Not At Risk  . Fear of Current or Ex-Partner: No  . Emotionally Abused: No  . Physically Abused: No  . Sexually Abused: No   No current facility-administered medications on file prior to encounter.   Current Outpatient Medications on File Prior to Encounter  Medication Sig Dispense Refill  . lamoTRIgine (LAMICTAL) 25 MG tablet Take 1 tablet (25 mg total) by mouth daily. Take one tablet daily for a week and then start taking 2 tablets. 60 tablet 0  . ondansetron (ZOFRAN ODT) 4 MG disintegrating tablet Take 1 tablet (4 mg total) by mouth every 6 (six) hours as needed for nausea. 20 tablet 0  . aspirin-acetaminophen-caffeine (EXCEDRIN MIGRAINE) 250-250-65 MG tablet Take 1 tablet by mouth every 6 (six) hours as needed for headache.    . Doxylamine-Pyridoxine (DICLEGIS) 10-10 MG TBEC Take 2 tablets by mouth at bedtime. If symptoms persist, add one  tablet in the morning and one in the afternoon 100 tablet 5   No Known Allergies  ROS:  Review of Systems  Constitutional: Negative for chills, fatigue and fever.  Respiratory: Negative for shortness of breath.   Cardiovascular: Negative for chest pain.  Gastrointestinal: Positive for nausea and vomiting. Negative for abdominal pain.  Genitourinary: Negative for difficulty urinating, dysuria, flank pain, pelvic pain, vaginal bleeding, vaginal discharge and vaginal pain.  Neurological: Negative for dizziness and headaches.  Psychiatric/Behavioral: Negative.      I have reviewed patient's Past Medical Hx, Surgical Hx,  Family Hx, Social Hx, medications and allergies.   Physical Exam   Patient Vitals for the past 24 hrs:  BP Temp Pulse Resp SpO2 Weight  07/06/19 1814 130/67 98.6 F (37 C) 83 16 98 % --  07/06/19 1810 -- -- -- -- -- 80.3 kg   Constitutional: Well-developed, well-nourished female in no acute distress.  Cardiovascular: normal rate Respiratory: normal effort GI: Abd soft, non-tender. Pos BS x 4 MS: Extremities nontender, no edema, normal ROM Neurologic: Alert and oriented x 4.  GU: Neg CVAT.    LAB RESULTS Results for orders placed or performed during the hospital encounter of 07/06/19 (from the past 24 hour(s))  Urinalysis, Routine w reflex microscopic     Status: Abnormal   Collection Time: 07/06/19  6:22 PM  Result Value Ref Range   Color, Urine AMBER (A) YELLOW   APPearance HAZY (A) CLEAR   Specific Gravity, Urine 1.030 1.005 - 1.030   pH 5.0 5.0 - 8.0   Glucose, UA NEGATIVE NEGATIVE mg/dL   Hgb urine dipstick NEGATIVE NEGATIVE   Bilirubin Urine NEGATIVE NEGATIVE   Ketones, ur 80 (A) NEGATIVE mg/dL   Protein, ur 30 (A) NEGATIVE mg/dL   Nitrite NEGATIVE NEGATIVE   Leukocytes,Ua NEGATIVE NEGATIVE   RBC / HPF 0-5 0 - 5 RBC/hpf   WBC, UA 0-5 0 - 5 WBC/hpf   Bacteria, UA RARE (A) NONE SEEN   Squamous Epithelial / LPF 0-5 0 - 5   Mucus PRESENT     --/--/O NEG (04/22 1709)  IMAGING   MAU Management/MDM: Orders Placed This Encounter  Procedures  . Urinalysis, Routine w reflex microscopic  . Discharge patient    Meds ordered this encounter  Medications  . lactated ringers bolus 1,000 mL  . metoCLOPramide (REGLAN) injection 10 mg  . famotidine (PEPCID) IVPB 20 mg premix  . promethazine (PHENERGAN) injection 12.5 mg  . lactated ringers bolus 1,000 mL  . metoCLOPramide (REGLAN) 10 MG tablet    Sig: Take 1 tablet (10 mg total) by mouth 3 (three) times daily with meals as needed for nausea.    Dispense:  30 tablet    Refill:  3    Order Specific Question:    Supervising Provider    Answer:   Adam Phenix [[id number]]  . promethazine (PHENERGAN) 25 MG tablet    Sig: Take 0.5-1 tablets (12.5-25 mg total) by mouth every 6 (six) hours as needed for nausea.    Dispense:  30 tablet    Refill:  2    Order Specific Question:   Supervising Provider    Answer:   Adam Phenix [[id number]]    Symptoms improved with LR x 2 liters, Reglan 10 mg IV, Pepcid 20 mg IV, and Phenergan 12.5 mg IV.  D/C home with Rx for Reglan and Phenergan, pt to f/u in office as scheduled.  Return to MAU as needed for emergencies.  ASSESSMENT 1. Nausea and vomiting during pregnancy prior to [redacted] weeks gestation     PLAN Discharge home Allergies as of 07/06/2019   No Known Allergies     Medication List    STOP taking these medications   aspirin-acetaminophen-caffeine 250-250-65 MG tablet Commonly known as: EXCEDRIN MIGRAINE     TAKE these medications   Doxylamine-Pyridoxine 10-10 MG Tbec Commonly known as: Diclegis Take 2 tablets by mouth at bedtime. If symptoms persist, add one tablet in the morning and one in the afternoon   lamoTRIgine 25 MG tablet Commonly known as: LAMICTAL Take 1 tablet (25 mg total) by mouth daily. Take one tablet daily for a week and then start taking 2 tablets.   metoCLOPramide 10 MG tablet Commonly known as: REGLAN Take 1 tablet (10 mg total) by mouth 3 (three) times daily with meals as needed for nausea.   ondansetron 4 MG disintegrating tablet Commonly known as: Zofran ODT Take 1 tablet (4 mg total) by mouth every 6 (six) hours as needed for nausea.   promethazine 25 MG tablet Commonly known as: PHENERGAN Take 0.5-1 tablets (12.5-25 mg total) by mouth every 6 (six) hours as needed for nausea.        Fatima Blank Certified Nurse-Midwife 07/06/2019  10:15 PM

## 2019-07-06 NOTE — MAU Note (Signed)
.   Sally Haas is a 24 y.o. at [redacted]w[redacted]d here in MAU reporting: morning sickness and states that she has not been able to keep anything down since Sunday night. Denies pain just reports dizziness  Onset of complaint: since Sunday  Pain score: 0 Vitals:   07/06/19 1814  BP: 130/67  Pulse: 83  Resp: 16  Temp: 98.6 F (37 C)  SpO2: 98%     FHT: Lab orders placed from triage: UA

## 2019-07-09 ENCOUNTER — Ambulatory Visit: Payer: PRIVATE HEALTH INSURANCE | Admitting: Women's Health

## 2019-07-16 ENCOUNTER — Encounter: Payer: Self-pay | Admitting: Family Medicine

## 2019-07-16 ENCOUNTER — Other Ambulatory Visit: Payer: Self-pay

## 2019-07-16 ENCOUNTER — Ambulatory Visit (INDEPENDENT_AMBULATORY_CARE_PROVIDER_SITE_OTHER): Payer: PRIVATE HEALTH INSURANCE | Admitting: Clinical

## 2019-07-16 ENCOUNTER — Telehealth (INDEPENDENT_AMBULATORY_CARE_PROVIDER_SITE_OTHER): Payer: PRIVATE HEALTH INSURANCE | Admitting: *Deleted

## 2019-07-16 ENCOUNTER — Telehealth: Payer: PRIVATE HEALTH INSURANCE

## 2019-07-16 DIAGNOSIS — D696 Thrombocytopenia, unspecified: Secondary | ICD-10-CM

## 2019-07-16 DIAGNOSIS — F3181 Bipolar II disorder: Secondary | ICD-10-CM

## 2019-07-16 DIAGNOSIS — O26899 Other specified pregnancy related conditions, unspecified trimester: Secondary | ICD-10-CM

## 2019-07-16 DIAGNOSIS — O99340 Other mental disorders complicating pregnancy, unspecified trimester: Secondary | ICD-10-CM

## 2019-07-16 DIAGNOSIS — F32A Depression, unspecified: Secondary | ICD-10-CM | POA: Insufficient documentation

## 2019-07-16 DIAGNOSIS — Z3490 Encounter for supervision of normal pregnancy, unspecified, unspecified trimester: Secondary | ICD-10-CM | POA: Insufficient documentation

## 2019-07-16 DIAGNOSIS — F411 Generalized anxiety disorder: Secondary | ICD-10-CM

## 2019-07-16 DIAGNOSIS — O99210 Obesity complicating pregnancy, unspecified trimester: Secondary | ICD-10-CM

## 2019-07-16 DIAGNOSIS — F329 Major depressive disorder, single episode, unspecified: Secondary | ICD-10-CM

## 2019-07-16 DIAGNOSIS — Z6791 Unspecified blood type, Rh negative: Secondary | ICD-10-CM

## 2019-07-16 MED ORDER — BLOOD PRESSURE KIT DEVI: 1.0000 | 0 refills | Status: DC | PRN

## 2019-07-16 NOTE — BH Specialist Note (Signed)
Integrated Behavioral Health via Telemedicine Video (Caregility) Visit  07/16/2019 Sally Haas [id number]  Number of Integrated Behavioral Health visits: 2 Session Start time: 9:08  Session End time: 9:20 Total time: 12  Referring Provider: Tinnie Gens, MD Type of Visit: Video Patient/Family location: Home [facility] Provider location: [facility]  All persons participating in visit: [name] and [facility] Sally Haas    Confirmed patient's address: Yes  Confirmed patient's phone number: Yes  Any changes to demographics: No   Confirmed patient's insurance: Yes  Any changes to patient's insurance: No   Discussed confidentiality: at previous visi  I connected with Sally Haas by a video enabled telemedicine application (Caregility) and verified that I am speaking with the correct person using two identifiers.     I discussed the limitations of evaluation and management by telemedicine and the availability of in person appointments.  I discussed that the purpose of this visit is to provide behavioral health care while limiting exposure to the novel coronavirus.   Discussed there is a possibility of technology failure and discussed alternative modes of communication if that failure occurs.  I discussed that engaging in this virtual visit, they consent to the provision of behavioral healthcare and the services will be billed under their insurance.  Patient and/or legal guardian expressed understanding and consented to virtual visit: Yes   PRESENTING CONCERNS: Patient reports the following symptoms/concerns: Pt states her primary concern today is having SI about two weeks ago, that she attributes to "all day morning sickness". Pt had thoughts of "using a knife and bleeding out in the tub", without intent, and thought that followed was to call her mother if thought remained or returned. Pt will see psychiatrist on  Thursday, and verbally agrees for [facility] to inform her psychiatrist that she was having SI. Pt also agrees to contact crisis number 24/7 if SI returns prior to visit with psychiatrist, as well as calling her mother.  Duration of problem: Increase in current pregnancy; Severity of problem: severe  OBJECTIVE: Mood: Normal and Affect: Appropriate Risk of harm to self or others: Suicidal ideation Suicide plan Pt had thoughts of self-harm with a knife and "bleeding out in the tub", with no plan/intent to carry out thought    STRENGTHS (Protective Factors/Coping Skills): Adhering to treatment via psychiatry  GOALS ADDRESSED: Patient will: 1.  Reduce symptoms of: anxiety, depression and mood instability  2.  Demonstrate ability to: Increase healthy adjustment to current life circumstances  INTERVENTIONS: Interventions utilized:  Safety Standardized Assessments completed: C-SSRS Short, GAD-7 and PHQ 9  ASSESSMENT: Patient currently experiencing Bipolar 2 disorder and Generalized anxiety disorder (both as previously diagnosed via psychiatry).   Patient may benefit from safety plan today.  PLAN: 1. Follow up with behavioral health clinician on : [facility] will call from [phone] for mood check in one day, 07/17/19 2. Behavioral recommendations:  -Attend Psychiatry appointment on 07/19/19 -Continue taking BH medication as prescribed by psychiatry -Follow safety plan  3. Referral(s): Integrated [employer] (In Clinic)  I discussed the assessment and treatment plan with the patient and/or parent/guardian. They were provided an opportunity to ask questions and all were answered. They agreed with the plan and demonstrated an understanding of the instructions.   They were advised to call back or seek an in-person evaluation if the symptoms worsen or if the condition fails to improve as anticipated.  Sally Haas   Depression screen PHQ  2/9 07/16/2019 06/26/2019 05/31/2019 05/10/2019   Decreased Interest 0 3 1 0  Down, Depressed, Hopeless 0 3 1 0  PHQ - 2 Score 0 6 2 0  Altered sleeping 0 3 2 0  Tired, decreased energy 3 3 3  0  Change in appetite 3 3 3  0  Feeling bad or failure about yourself  0 3 1 0  Trouble concentrating 0 3 0 0  Moving slowly or fidgety/restless 0 1 0 0  Suicidal thoughts 1 0 0 0  PHQ-9 Score 7 22 11  0  Difficult doing work/chores - - - Not difficult at all   GAD 7 : Generalized Anxiety Score 07/16/2019 06/26/2019 05/31/2019 05/10/2019  Nervous, Anxious, on Edge 1 3 2 1   Control/stop worrying 0 3 1 0  Worry too much - different things 0 3 0 0  Trouble relaxing 0 3 0 0  Restless 0 3 0 0  Easily annoyed or irritable 2 3 0 1  Afraid - awful might happen 0 3 0 0  Total GAD 7 Score 3 21 3  2

## 2019-07-16 NOTE — Patient Instructions (Signed)

## 2019-07-16 NOTE — Progress Notes (Signed)
8:20 I called Sally Haas for her new ob intake visit and left a message I was calling her to begin her visit and I will call again in a few minutes to begin her MyChart visit.  Noralyn Karim,RN  I connected with  Sally Haas on 07/16/19 at  8:15 AM EDT virtually and verified that I am speaking with the correct person using two identifiers.   I discussed the limitations, risks, security and privacy concerns of performing an evaluation and management service by virtual and the availability of in person appointments. I also discussed with the patient that there may be a patient responsible charge related to this service. The patient expressed understanding and agreed to proceed.  I explained I am completing her New OB Intake today. We discussed Her EDD and that it is based on  An early [place].  I reviewed her allergies, meds, OB History, Medical /Surgical history, and appropriate screenings. She did answer PHQ 9 that sometimes she had thoughts of better off dead; but does not have a plan.   I informed her of [facility] services and she has spoken with Asher Muir, our [facility] before. I asked Asher Muir to come in and see Sally Haas virtually today during her visit. Asher Muir spoke with her and made a referral to her psychiatrist.  She reports feeling so bad from her nausea that it makes her feel more depressed. She states her insurance did not cover the diclegis. She has Reglan and phenergan. I encouraged her to take the Reglan TID as prescribed and the phenergan as needed. I also informed her she can take Unison and Vitamin B6 and sent her a Clinical cytogeneticist message with dosages also for the nausea. I encouraged her to discuss with provider at her first ob visit and contact us sooner if needed.    I explained I will send her the Bab[employer]s app and app was sent to her while on phone. She will complete this later.  I explained we will ask her to take her blood pressure weekly during her pregnancy. She does not have a blood presure cuff.used on the arm;  but does have a wrist cuff.  We discusssed I can send a blood pressure cuff to [facility] but her insurance may not cover it. I explained I will call [facility] to verify they received her prescription and confirm if it is covered by her insurance and will send her a MyChart message later today. I asked her to bring her  blood pressure cuff and the new one if it is filled  with her to her first ob appointment so we can show her how to use it. Explained  then we will have her take her blood pressure weekly and enter into the app.  I explained she will have some visits in office and some virtually. She already has Sports coach.  I reviewed her new ob  appointment date/ time with her , our location and to wear mask,onevisitor.   I explained she will have a pelvic exam, ob bloodwork, hemoglobin a1C, cbg ,pap, and  genetic testing if desired,- she does want a panorama.  I scheduled an Korea at 19 weeks and gave her the appointment. She voices understanding.   Breonna Gafford,RN 07/16/2019  8:29 AM

## 2019-07-18 ENCOUNTER — Inpatient Hospital Stay (HOSPITAL_COMMUNITY)
Admission: AD | Admit: 2019-07-18 | Discharge: 2019-07-19 | Disposition: A | Discharge status: Home / Self Care | Payer: PRIVATE HEALTH INSURANCE | Attending: Obstetrics & Gynecology | Admitting: Obstetrics & Gynecology

## 2019-07-18 DIAGNOSIS — O09291 Supervision of pregnancy with other poor reproductive or obstetric history, first trimester: Secondary | ICD-10-CM | POA: Diagnosis not present

## 2019-07-18 DIAGNOSIS — Z87442 Personal history of urinary calculi: Secondary | ICD-10-CM | POA: Insufficient documentation

## 2019-07-18 DIAGNOSIS — O99340 Other mental disorders complicating pregnancy, unspecified trimester: Secondary | ICD-10-CM

## 2019-07-18 DIAGNOSIS — Z8759 Personal history of other complications of pregnancy, childbirth and the puerperium: Secondary | ICD-10-CM | POA: Insufficient documentation

## 2019-07-18 DIAGNOSIS — O99281 Endocrine, nutritional and metabolic diseases complicating pregnancy, first trimester: Secondary | ICD-10-CM | POA: Insufficient documentation

## 2019-07-18 DIAGNOSIS — E860 Dehydration: Secondary | ICD-10-CM | POA: Diagnosis not present

## 2019-07-18 DIAGNOSIS — Z3A09 9 weeks gestation of pregnancy: Secondary | ICD-10-CM | POA: Insufficient documentation

## 2019-07-18 DIAGNOSIS — O211 Hyperemesis gravidarum with metabolic disturbance: Secondary | ICD-10-CM | POA: Diagnosis not present

## 2019-07-18 DIAGNOSIS — O210 Mild hyperemesis gravidarum: Secondary | ICD-10-CM | POA: Diagnosis present

## 2019-07-18 DIAGNOSIS — Z79899 Other long term (current) drug therapy: Secondary | ICD-10-CM | POA: Insufficient documentation

## 2019-07-18 LAB — URINALYSIS, ROUTINE W REFLEX MICROSCOPIC
Bilirubin Urine: NEGATIVE
Glucose, UA: NEGATIVE mg/dL
Ketones, ur: 20 mg/dL — AB
Nitrite: NEGATIVE
Protein, ur: 30 mg/dL — AB
Specific Gravity, Urine: 1.024 (ref 1.005–1.030)
pH: 6 (ref 5.0–8.0)

## 2019-07-18 NOTE — MAU Note (Signed)
Unable to keep down anything today. When I am not working I do better but today has been esp bad. Taking nausea meds but not helping. Denies VB but a lot of clear/yellow d/c

## 2019-07-19 ENCOUNTER — Telehealth (INDEPENDENT_AMBULATORY_CARE_PROVIDER_SITE_OTHER): Payer: PRIVATE HEALTH INSURANCE | Admitting: Psychiatry

## 2019-07-19 ENCOUNTER — Encounter (HOSPITAL_COMMUNITY): Payer: Self-pay | Admitting: Psychiatry

## 2019-07-19 DIAGNOSIS — F411 Generalized anxiety disorder: Secondary | ICD-10-CM

## 2019-07-19 DIAGNOSIS — F3181 Bipolar II disorder: Secondary | ICD-10-CM | POA: Diagnosis not present

## 2019-07-19 DIAGNOSIS — O210 Mild hyperemesis gravidarum: Secondary | ICD-10-CM | POA: Diagnosis not present

## 2019-07-19 DIAGNOSIS — Z3A09 9 weeks gestation of pregnancy: Secondary | ICD-10-CM

## 2019-07-19 DIAGNOSIS — O211 Hyperemesis gravidarum with metabolic disturbance: Secondary | ICD-10-CM

## 2019-07-19 DIAGNOSIS — E860 Dehydration: Secondary | ICD-10-CM

## 2019-07-19 LAB — COMPREHENSIVE METABOLIC PANEL
ALT: 24 U/L (ref 0–44)
AST: 21 U/L (ref 15–41)
Albumin: 4.3 g/dL (ref 3.5–5.0)
Alkaline Phosphatase: 43 U/L (ref 38–126)
Anion gap: 12 (ref 5–15)
BUN: 7 mg/dL (ref 6–20)
CO2: 21 mmol/L — ABNORMAL LOW (ref 22–32)
Calcium: 9.6 mg/dL (ref 8.9–10.3)
Chloride: 103 mmol/L (ref 98–111)
Creatinine, Ser: 0.55 mg/dL (ref 0.44–1.00)
GFR calc Af Amer: >60 mL/min (ref >60)
GFR calc non Af Amer: >60 mL/min (ref >60)
Glucose, Bld: 81 mg/dL (ref 70–99)
Potassium: 3.8 mmol/L (ref 3.5–5.1)
Sodium: 136 mmol/L (ref 135–145)
Total Bilirubin: 0.9 mg/dL (ref 0.3–1.2)
Total Protein: 7.5 g/dL (ref 6.5–8.1)

## 2019-07-19 MED ORDER — FAMOTIDINE IN NACL 20-0.9 MG/50ML-% IV SOLN
20.0000 mg | Freq: Once | INTRAVENOUS | Status: AC
  Administered 2019-07-19: 20 mg via INTRAVENOUS
  Filled 2019-07-19: qty 50

## 2019-07-19 MED ORDER — SCOPOLAMINE 1 MG/3DAYS TD PT72: 1.0000 | MEDICATED_PATCH | TRANSDERMAL | 12 refills | Status: DC

## 2019-07-19 MED ORDER — ONDANSETRON HCL 4 MG/2ML IJ SOLN
4.0000 mg | Freq: Once | INTRAMUSCULAR | Status: AC
  Administered 2019-07-19: 4 mg via INTRAVENOUS
  Filled 2019-07-19: qty 2

## 2019-07-19 MED ORDER — LACTATED RINGERS IV BOLUS
1000.0000 mL | Freq: Once | INTRAVENOUS | Status: AC
  Administered 2019-07-19: 1000 mL via INTRAVENOUS

## 2019-07-19 MED ORDER — LAMOTRIGINE 25 MG PO TABS: 50.0000 mg | ORAL_TABLET | Freq: Every day | ORAL | 0 refills | Status: DC

## 2019-07-19 MED ORDER — PANTOPRAZOLE SODIUM 20 MG PO TBEC
20.0000 mg | DELAYED_RELEASE_TABLET | Freq: Every day | ORAL | 1 refills | Status: DC
Start: 2019-07-19 — End: 2021-05-20

## 2019-07-19 MED ORDER — LACTATED RINGERS IV SOLN: Freq: Once | INTRAVENOUS | Status: AC

## 2019-07-19 NOTE — MAU Provider Note (Signed)
Chief Complaint: Emesis During Pregnancy   First Provider Initiated Contact with Patient 07/19/19 0106        SUBJECTIVE HPI: Sally Haas is a 23 y.o. [id number] at 71w3dby LMP who presents to maternity admissions reporting nausea and vomiting.  Doing OK some days but today had trouble with meds not helping.  Unable to keep anything down. . She denies vaginal bleeding, vaginal itching/burning, urinary symptoms, h/a, dizziness, n/v, or fever/chills.    Emesis  This is a recurrent problem. The current episode started today. The problem has been unchanged. There has been no fever. Pertinent negatives include no abdominal pain, chest pain, chills, diarrhea, dizziness, fever or myalgias. Treatments tried: meds. The treatment provided no relief.    RN Note: Unable to keep down anything today. When I am not working I do better but today has been esp bad. Taking nausea meds but not helping. Denies VB but a lot of clear/yellow d/c  Past Medical History:  Diagnosis Date  . Ectopic pregnancy   . Kidney stone   . Thrombocytopenia ([place]    Past Surgical History:  Procedure Laterality Date  . DIAGNOSTIC LAPAROSCOPY WITH REMOVAL OF ECTOPIC PREGNANCY Right 05/07/2019   Procedure: Laparoscopic Removal Of Ectopic Pregnancy;  Surgeon: PDonnamae Jude MD;  [place]  Service: Gynecology;  Laterality: Right;  . LAPAROSCOPIC CHOLECYSTECTOMY     Social History   Socioeconomic History  . Marital status: Married    Spouse name: Not on file  . Number of children: Not on file  . Years of education: Not on file  . Highest education level: Not on file  Occupational History  . Not on file  Tobacco Use  . Smoking status: Never Smoker  . Smokeless tobacco: Never Used  Vaping Use  . Vaping Use: Never used  Substance and Sexual Activity  . Alcohol use: Never  . Drug use: Never  . Sexual activity: Yes    Birth control/protection: None  Other Topics Concern  . Not on file  Social History  Narrative  . Not on file   Social Determinants of Health   Financial Resource Strain:   . Difficulty of Paying Living Expenses:   Food Insecurity: No Food Insecurity  . Worried About RCharity fundraiserin the Last Year: Never true  . Ran Out of Food in the Last Year: Never true  Transportation Needs: No Transportation Needs  . Lack of Transportation (Medical): No  . Lack of Transportation (Non-Medical): No  Physical Activity:   . Days of Exercise per Week:   . Minutes of Exercise per Session:   Stress:   . Feeling of Stress :   Social Connections:   . Frequency of Communication with Friends and Family:   . Frequency of Social Gatherings with Friends and Family:   . Attends Religious Services:   . Active Member of Clubs or Organizations:   . Attends CArchivistMeetings:   .Marland KitchenMarital Status:   Intimate Partner Violence: Not At Risk  . Fear of Current or Ex-Partner: No  . Emotionally Abused: No  . Physically Abused: No  . Sexually Abused: No   No current facility-administered medications on file prior to encounter.   Current Outpatient Medications on File Prior to Encounter  Medication Sig Dispense Refill  . lamoTRIgine (LAMICTAL) 25 MG tablet Take 1 tablet (25 mg total) by mouth daily. Take one tablet daily for a week and then start taking 2 tablets. 60 tablet  0  . metoCLOPramide (REGLAN) 10 MG tablet Take 1 tablet (10 mg total) by mouth 3 (three) times daily with meals as needed for nausea. 30 tablet 3  . Blood Pressure Monitoring (BLOOD PRESSURE KIT) DEVI 1 Device by Does not apply route as needed. 1 each 0  . Doxylamine-Pyridoxine (DICLEGIS) 10-10 MG TBEC Take 2 tablets by mouth at bedtime. If symptoms persist, add one tablet in the morning and one in the afternoon 100 tablet 5  . ondansetron (ZOFRAN ODT) 4 MG disintegrating tablet Take 1 tablet (4 mg total) by mouth every 6 (six) hours as needed for nausea. (Patient not taking: Reported on 07/16/2019) 20 tablet 0  .  promethazine (PHENERGAN) 25 MG tablet Take 0.5-1 tablets (12.5-25 mg total) by mouth every 6 (six) hours as needed for nausea. 30 tablet 2   No Known Allergies  I have reviewed patient's Past Medical Hx, Surgical Hx, Family Hx, Social Hx, medications and allergies.   ROS:  Review of Systems  Constitutional: Negative for chills and fever.  Cardiovascular: Negative for chest pain.  Gastrointestinal: Positive for vomiting. Negative for abdominal pain and diarrhea.  Musculoskeletal: Negative for myalgias.  Neurological: Negative for dizziness.   Review of Systems  Other systems negative   Physical Exam  Physical Exam Patient Vitals for the past 24 hrs:  BP Temp Pulse Resp Height Weight  07/18/19 2256 114/73 98.4 F (36.9 C) 89 18 _0  (1.651 m) 78.5 kg   Constitutional: Well-developed, well-nourished female in no acute distress.  Cardiovascular: normal rate Respiratory: normal effort GI: Abd soft, non-tender. Pos BS x 4 MS: Extremities nontender, no edema, normal ROM Neurologic: Alert and oriented x 4.  GU: Neg CVAT.  PELVIC EXAM: deferred  LAB RESULTS Results for orders placed or performed during the hospital encounter of 07/18/19 (from the past 24 hour(s))  Urinalysis, Routine w reflex microscopic     Status: Abnormal   Collection Time: 07/18/19 11:03 PM  Result Value Ref Range   Color, Urine AMBER (A) YELLOW   APPearance CLOUDY (A) CLEAR   Specific Gravity, Urine 1.024 1.005 - 1.030   pH 6.0 5.0 - 8.0   Glucose, UA NEGATIVE NEGATIVE mg/dL   Hgb urine dipstick SMALL (A) NEGATIVE   Bilirubin Urine NEGATIVE NEGATIVE   Ketones, ur 20 (A) NEGATIVE mg/dL   Protein, ur 30 (A) NEGATIVE mg/dL   Nitrite NEGATIVE NEGATIVE   Leukocytes,Ua LARGE (A) NEGATIVE   RBC / HPF 0-5 0 - 5 RBC/hpf   WBC, UA 21-50 0 - 5 WBC/hpf   Bacteria, UA RARE (A) NONE SEEN   Squamous Epithelial / LPF 0-5 0 - 5   Mucus PRESENT     --/--/O NEG (04/22 1709)  IMAGING   MAU  Management/MDM: Ordered IV fluids with Zofran IV for nausea.  CMET ordered which was WNL Also added Pepcid for acid reflux.   WIll add Protonix to regimen at home Will also add Scopolamine patch for home use After two liters, has had no vomiting, so discharged home.   ASSESSMENT Single IUP at 70w3dHyperemesis Dehydration  PLAN Discharge home Rx Protonix for acid reduction Rx scopolamine patch for nausea Continue other meds for nausea.  Pt stable at time of discharge. Encouraged to return here or to other Urgent Care/ED if she develops worsening of symptoms, increase in pain, fever, or other concerning symptoms.    MHansel FeinsteinCNM, MSN Certified Nurse-Midwife 07/19/2019  1:06 AM

## 2019-07-19 NOTE — Progress Notes (Signed)
[place] Follow up visit  Patient Identification: Sally Haas MRN:  [id number] Date of Evaluation:  07/19/2019 Referral Source: primary care  Chief Complaint:  mood symptoms Visit Diagnosis:    ICD-10-CM   1. Bipolar 2 disorder (HCC)  F31.81   2. GAD (generalized anxiety disorder)  F41.1      I connected with Sally Haas on 07/19/19 at  9:00 AM EDT by a video enabled telemedicine application and verified that I am speaking with the correct person using two identifiers.      I discussed the limitations of evaluation and management by telemedicine and the availability of in person appointments. The patient expressed understanding and agreed to proceed.  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 Their aware of her medications including lamictal. She has fu with OB next week  Patient understands the risk of being on meds first trimester but med has helped    Worries related to her job, she wants to step down and is getting stressful and she is having diffiuclty taking time out  Not suicidal. Pregnancy was unexpected but she is planning and looking forward to it.   Aggravating factor: difficult childhood, husband was not working, difficult postpartum Modifying factor; daughter, animals, pets  Duration since high school  Drug use denies   Associated Signs/Symptoms:   Past Psychiatric History: mood symptoms, depression    Past Medical History:  Past Medical History:  Diagnosis Date  . Ectopic pregnancy   . Kidney stone   . Thrombocytopenia ([place])      Past Surgical History:  Procedure Laterality Date  . DIAGNOSTIC LAPAROSCOPY WITH REMOVAL OF ECTOPIC PREGNANCY Right 05/07/2019   Procedure: Laparoscopic Removal Of Ectopic Pregnancy;  Surgeon: Donnamae Jude, MD;  Location: [place];  Service: Gynecology;  Laterality: Right;  . LAPAROSCOPIC CHOLECYSTECTOMY      Family Psychiatric History: says dad was unstable , mom had mood symptoms but no diagnosis   Family History:  Family History  Problem Relation Age of Onset  . Alcohol abuse Father     Social History:   Social History   Socioeconomic History  . Marital status: Married    Spouse name: Not on file  . Number of children: Not on file  . Years of education: Not on file  . Highest education level: Not on file  Occupational History  . Not on file  Tobacco Use  . Smoking status: Never Smoker  . Smokeless tobacco: Never Used  Vaping Use  . Vaping Use: Never used  Substance and Sexual Activity  . Alcohol use: Never  . Drug use: Never  . Sexual activity: Yes    Birth control/protection: None  Other Topics Concern  . Not on file  Social History Narrative  . Not on file   Social Determinants of Health   Financial Resource Strain:   . Difficulty of Paying Living Expenses:   Food Insecurity: No Food Insecurity  . Worried About Charity fundraiser in the Last Year: Never true  . Ran Out of Food in the Last Year:  Never true  Transportation Needs: No Transportation Needs  . Lack of Transportation (Medical): No  . Lack of Transportation (Non-Medical): No  Physical Activity:   . Days of Exercise per Week:   . Minutes of Exercise per Session:   Stress:   . Feeling of Stress :   Social Connections:   . Frequency of Communication with Friends and Family:   . Frequency of Social Gatherings with Friends and Family:   . Attends Religious Services:   . Active Member of Clubs or Organizations:   . Attends Archivist Meetings:   Marland Kitchen Marital Status:        Allergies:  No Known Allergies  Metabolic Disorder Labs: No results found for: HGBA1C, MPG No results found for: PROLACTIN No results found for: CHOL, TRIG, HDL, CHOLHDL, VLDL, LDLCALC No results found for: TSH  Therapeutic Level Labs: No results found for: LITHIUM No results found for: CBMZ No results found for: VALPROATE  Current Medications: Current Outpatient Medications  Medication Sig Dispense Refill  . Blood Pressure Monitoring (BLOOD PRESSURE KIT) DEVI 1 Device by Does not apply route as needed. 1 each 0  . Doxylamine-Pyridoxine (DICLEGIS) 10-10 MG TBEC Take 2 tablets by mouth at bedtime. If symptoms persist, add one tablet in the morning and one in the afternoon 100 tablet 5  . lamoTRIgine (LAMICTAL) 25 MG tablet Take 2 tablets (50 mg total) by mouth daily. 60 tablet 0  . metoCLOPramide (REGLAN) 10 MG tablet Take 1 tablet (10 mg total) by mouth 3 (three) times daily with meals as needed for nausea. 30 tablet 3  . ondansetron (ZOFRAN ODT) 4 MG disintegrating tablet Take 1 tablet (4 mg total) by mouth every 6 (six) hours as needed for nausea. (Patient not taking: Reported on 07/16/2019) 20 tablet 0  . pantoprazole (PROTONIX) 20 MG tablet Take 1 tablet (20 mg total) by mouth daily. 30 tablet 1  . promethazine (PHENERGAN) 25 MG tablet Take 0.5-1 tablets (12.5-25 mg total) by mouth every 6 (six) hours as needed for nausea. 30 tablet 2  . scopolamine (TRANSDERM-SCOP, 1.5 MG,) 1 MG/3DAYS Place 1 patch (1.5 mg total) onto the skin every 3 (three) days. 10 patch 12   No current facility-administered medications for this visit.    Musculoskeletal:   Psychiatric Specialty Exam: Review of Systems  Cardiovascular: Negative for chest pain.  Skin: Negative for rash.  Psychiatric/Behavioral: Negative for agitation, behavioral problems and hallucinations.    There were no vitals taken for this visit.There is no height or weight on file to calculate BMI.  General Appearance:  Casual  Eye Contact:  Fair  Speech:  Normal Rate  Volume:  Decreased  Mood:  Fair   Affect:  Congruent  Thought Process:  Goal Directed  Orientation:  Full (Time, Place, and Person)  Thought Content:  Rumination  Suicidal Thoughts:  No  Homicidal Thoughts:  No  Memory:  Immediate;   Fair Recent;   Fair  Judgement:  Fair  Insight:  Shallow  Psychomotor Activity:  Normal  Concentration:  Concentration: Fair and Attention Span: Fair  Recall:  [employer] of Knowledge:Good  Language: Good  Akathisia:  No  Handed:    AIMS (if indicated):  not done  Assets:  Desire for Improvement Financial Resources/Insurance Leisure Time Physical Health  ADL's:  Intact  Cognition: WNL  Sleep:  Fair   Screenings: GAD-7     Video Visit from 07/16/2019 in [place] for [employer] at [facility]  for [place] from 06/26/2019 in Center for [place] at [facility] Office Visit from 05/31/2019 in [place] for [place] at [facility] for Women Erroneous Encounter from 05/10/2019 in [place] for [facility]  Total GAD-7 Score _0 PHQ2-9     Video Visit from 07/16/2019 in Center for [place] at [facility] [place] from 06/26/2019 in Center for [place] at [facility] for Women Office Visit from 05/31/2019 in Center for [place] at [facility] for Women Erroneous Encounter from 05/10/2019 in Center for [facility]  PHQ-2 Total Score 0 6 2 0  PHQ-9 Total Score _1 0      Assessment and Plan: as follows  Bipolar 2: current episode unspecified or depressed; not hopeless or suicidal, stress related to job and pregnancy. 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  GAD: would hold off from SSRI for now, considering she is pregnant, schedule therapy Fu with OB   I discussed the assessment and treatment plan with the patient. The patient was provided an opportunity to ask questions and all were answered. The patient agreed with the plan and demonstrated an understanding of the instructions.   The patient was advised to call back or seek an in-person evaluation if the symptoms worsen or if the condition fails to improve as anticipated. Fu 3 weeks or earlier, also schedule therapy I provided 15- 20  minutes of non-face-to-face time during this encounter.  Merian Capron, MD 7/1/20219:18 AM

## 2019-07-19 NOTE — Discharge Instructions (Signed)
Hyperemesis Gravidarum Hyperemesis gravidarum is a severe form of nausea and vomiting that happens during pregnancy. Hyperemesis is worse than morning sickness. It may cause you to have nausea or vomiting all day for many days. It may keep you from eating and drinking enough food and liquids, which can lead to dehydration, malnutrition, and weight loss. Hyperemesis usually occurs during the first half (the first 20 weeks) of pregnancy. It often goes away once a woman is in her second half of pregnancy. However, sometimes hyperemesis continues through an entire pregnancy. What are the causes? The cause of this condition is not known. It may be related to changes in chemicals (hormones) in the body during pregnancy, such as the high level of pregnancy hormone (human chorionic gonadotropin) or the increase in the female sex hormone (estrogen). What are the signs or symptoms? Symptoms of this condition include:  Nausea that does not go away.  Vomiting that does not allow you to keep any food down.  Weight loss.  Body fluid loss (dehydration).  Having no desire to eat, or not liking food that you have previously enjoyed. How is this diagnosed? This condition may be diagnosed based on:  A physical exam.  Your medical history.  Your symptoms.  Blood tests.  Urine tests. How is this treated? This condition is managed by controlling symptoms. This may include:  Following an eating plan. This can help lessen nausea and vomiting.  Taking prescription medicines. An eating plan and medicines are often used together to help control symptoms. If medicines do not help relieve nausea and vomiting, you may need to receive fluids through an IV at the hospital. Follow these instructions at home: Eating and drinking   Avoid the following: ? Drinking fluids with meals. Try not to drink anything during the 30 minutes before and after your meals. ? Drinking more than 1 cup of fluid at a  time. ? Eating foods that trigger your symptoms. These may include spicy foods, coffee, high-fat foods, very sweet foods, and acidic foods. ? Skipping meals. Nausea can be more intense on an empty stomach. If you cannot tolerate food, do not force it. Try sucking on ice chips or other frozen items and make up for missed calories later. ? Lying down within 2 hours after eating. ? Being exposed to environmental triggers. These may include food smells, smoky rooms, closed spaces, rooms with strong smells, warm or humid places, overly loud and noisy rooms, and rooms with motion or flickering lights. Try eating meals in a well-ventilated area that is free of strong smells. ? Quick and sudden changes in your movement. ? Taking iron pills and multivitamins that contain iron. If you take prescription iron pills, do not stop taking them unless your health care provider approves. ? Preparing food. The smell of food can spoil your appetite or trigger nausea.  To help relieve your symptoms: ? Listen to your body. Everyone is different and has different preferences. Find what works best for you. ? Eat and drink slowly. ? Eat 5-6 small meals daily instead of 3 large meals. Eating small meals and snacks can help you avoid an empty stomach. ? In the morning, before getting out of bed, eat a couple of crackers to avoid moving around on an empty stomach. ? Try eating starchy foods as these are usually tolerated well. Examples include cereal, toast, bread, potatoes, pasta, rice, and pretzels. ? Include at least 1 serving of protein with your meals and snacks. Protein options include   lean meats, poultry, seafood, beans, nuts, nut butters, eggs, cheese, and yogurt. ? Try eating a protein-rich snack before bed. Examples of a protein-rick snack include cheese and crackers or a peanut butter sandwich made with 1 slice of whole-wheat bread and 1 tsp (5 g) of peanut butter. ? Eat or suck on things that have ginger in them.  It may help relieve nausea. Add  tsp ground ginger to hot tea or choose ginger tea. ? Try drinking 100% fruit juice or an electrolyte drink. An electrolyte drink contains sodium, potassium, and chloride. ? Drink fluids that are cold, clear, and carbonated or sour. Examples include lemonade, ginger ale, lemon-lime soda, ice water, and sparkling water. ? Brush your teeth or use a mouth rinse after meals. ? Talk with your health care provider about starting a supplement of vitamin B6. General instructions  Take over-the-counter and prescription medicines only as told by your health care provider.  Follow instructions from your health care provider about eating or drinking restrictions.  Continue to take your prenatal vitamins as told by your health care provider. If you are having trouble taking your prenatal vitamins, talk with your health care provider about different options.  Keep all follow-up and pre-birth (prenatal) visits as told by your health care provider. This is important. Contact a health care provider if:  You have pain in your abdomen.  You have a severe headache.  You have vision problems.  You are losing weight.  You feel weak or dizzy. Get help right away if:  You cannot drink fluids without vomiting.  You vomit blood.  You have constant nausea and vomiting.  You are very weak.  You faint.  You have a fever and your symptoms suddenly get worse. Summary  Hyperemesis gravidarum is a severe form of nausea and vomiting that happens during pregnancy.  Making some changes to your eating habits may help relieve nausea and vomiting.  This condition may be managed with medicine.  If medicines do not help relieve nausea and vomiting, you may need to receive fluids through an IV at the hospital. This information is not intended to replace advice given to you by your health care provider. Make sure you discuss any questions you have with your health care  provider. Document Revised: 01/24/2017 Document Reviewed: 09/03/2015 Elsevier Patient Education  2020 [employer] Inc.  

## 2019-07-21 LAB — CULTURE, OB URINE: Culture: >=100000 — AB

## 2019-07-22 ENCOUNTER — Telehealth: Payer: Self-pay | Admitting: Certified Nurse Midwife

## 2019-07-22 DIAGNOSIS — O2341 Unspecified infection of urinary tract in pregnancy, first trimester: Secondary | ICD-10-CM

## 2019-07-22 MED ORDER — AMOXICILLIN 500 MG PO CAPS: 500.0000 mg | ORAL_CAPSULE | Freq: Three times a day (TID) | ORAL | 0 refills | Status: DC

## 2019-07-22 NOTE — Telephone Encounter (Signed)
+  UTI. Rx sent. No answer, left message to call back.

## 2019-07-24 ENCOUNTER — Other Ambulatory Visit: Payer: Self-pay

## 2019-07-24 ENCOUNTER — Other Ambulatory Visit (HOSPITAL_COMMUNITY)
Admission: RE | Admit: 2019-07-24 | Discharge: 2019-07-24 | Disposition: A | Discharge status: Home / Self Care | Payer: PRIVATE HEALTH INSURANCE | Source: Ambulatory Visit | Attending: Advanced Practice Midwife | Admitting: Advanced Practice Midwife

## 2019-07-24 ENCOUNTER — Encounter: Payer: Self-pay | Admitting: Advanced Practice Midwife

## 2019-07-24 ENCOUNTER — Ambulatory Visit (INDEPENDENT_AMBULATORY_CARE_PROVIDER_SITE_OTHER): Payer: PRIVATE HEALTH INSURANCE | Admitting: Advanced Practice Midwife

## 2019-07-24 VITALS — BP 112/76 | HR 101 | Wt 169.7 lb

## 2019-07-24 DIAGNOSIS — Z3A10 10 weeks gestation of pregnancy: Secondary | ICD-10-CM

## 2019-07-24 DIAGNOSIS — Z3491 Encounter for supervision of normal pregnancy, unspecified, first trimester: Secondary | ICD-10-CM

## 2019-07-24 DIAGNOSIS — F3181 Bipolar II disorder: Secondary | ICD-10-CM

## 2019-07-24 DIAGNOSIS — Z3481 Encounter for supervision of other normal pregnancy, first trimester: Secondary | ICD-10-CM

## 2019-07-24 LAB — POCT URINALYSIS DIP (DEVICE)
Glucose, UA: NEGATIVE mg/dL
Hgb urine dipstick: NEGATIVE
Nitrite: NEGATIVE
Protein, ur: 30 mg/dL — AB
Specific Gravity, Urine: >=1.03 (ref 1.005–1.030)
Urobilinogen, UA: 0.2 mg/dL (ref 0.0–1.0)
pH: 6 (ref 5.0–8.0)

## 2019-07-24 NOTE — Progress Notes (Signed)
Subjective:   Sally Haas is a 24 y.o. [id number] at 23w1dby early ultrasound being seen today for her first obstetrical visit.  Her obstetrical history is significant for None. Patient does intend to breast feed. Pregnancy history fully reviewed.  Patient reports nausea and vomiting.  Patient has had 1 dose of covid vaccine. Planning second dose ASAP. Was holding off due to nausea/vomiting.   HISTORY: OB History  Gravida Para Term Preterm AB Living  4 1 1  0 2 1  SAB TAB Ectopic Multiple Live Births  1 0 1 0 1    # Outcome Date GA Lbr Len/2nd Weight Sex Delivery Anes PTL Lv  4 Current           3 Ectopic 05/07/19          2 Term 06/06/17 [id number] lb 14 oz (3.572 kg) F Vag-Spont   LIV     Birth Comments: placental previa that resolved, unable to have epidural because of low "iron"  1 SAB 08/2016 6w51w0d        Obstetric Comments  G1: early SAB  G2: TSVD 2019    Last pap smear was done NA (has not had one yet due to age) and was NA  Past Medical History:  Diagnosis Date  . Ectopic pregnancy   . Kidney stone   . Thrombocytopenia ([facility]  Past Surgical History:  Procedure Laterality Date  . DIAGNOSTIC LAPAROSCOPY WITH REMOVAL OF ECTOPIC PREGNANCY Right 05/07/2019   Procedure: Laparoscopic Removal Of Ectopic Pregnancy;  Surgeon: PraDonnamae JudeD;  [facility];  Laterality: Right;  . LAPAROSCOPIC CHOLECYSTECTOMY     Family History  Problem Relation Age of Onset  . Alcohol abuse Father    Social History   Tobacco Use  . Smoking status: Never Smoker  . Smokeless tobacco: Never Used  Vaping Use  . Vaping Use: Never used  Substance Use Topics  . Alcohol use: Never  . Drug use: Never   No Known Allergies Current Outpatient Medications on File Prior to Visit  Medication Sig Dispense Refill  . lamoTRIgine (LAMICTAL) 25 MG tablet Take 2 tablets (50 mg total) by mouth daily. 60 tablet 0  . metoCLOPramide (REGLAN) 10 MG tablet Take 1 tablet  (10 mg total) by mouth 3 (three) times daily with meals as needed for nausea. 30 tablet 3  . promethazine (PHENERGAN) 25 MG tablet Take 0.5-1 tablets (12.5-25 mg total) by mouth every 6 (six) hours as needed for nausea. 30 tablet 2  . amoxicillin (AMOXIL) 500 MG capsule Take 1 capsule (500 mg total) by mouth 3 (three) times daily. (Patient not taking: Reported on 07/24/2019) 21 capsule 0  . Blood Pressure Monitoring (BLOOD PRESSURE KIT) DEVI 1 Device by Does not apply route as needed. 1 each 0  . ondansetron (ZOFRAN ODT) 4 MG disintegrating tablet Take 1 tablet (4 mg total) by mouth every 6 (six) hours as needed for nausea. (Patient not taking: Reported on 07/16/2019) 20 tablet 0  . pantoprazole (PROTONIX) 20 MG tablet Take 1 tablet (20 mg total) by mouth daily. (Patient not taking: Reported on 07/24/2019) 30 tablet 1  . scopolamine (TRANSDERM-SCOP, 1.5 MG,) 1 MG/3DAYS Place 1 patch (1.5 mg total) onto the skin every 3 (three) days. (Patient not taking: Reported on 07/24/2019) 10 patch 12   No current facility-administered medications on file prior to visit.    Review of Systems Pertinent items noted in  HPI and remainder of comprehensive ROS otherwise negative.  Exam   Vitals:   07/24/19 1029  BP: 112/76  Pulse: (!) 101  Weight: 169 lb 11.2 oz (77 kg)   Fetal Heart Rate (bpm): 180  Physical Exam Vitals and nursing note reviewed. Exam conducted with a chaperone present.  Constitutional:      Appearance: Normal appearance.  HENT:     Head: Normocephalic.  Cardiovascular:     Rate and Rhythm: Normal rate.  Pulmonary:     Effort: Pulmonary effort is normal.  Skin:    General: Skin is warm and dry.  Neurological:     General: No focal deficit present.     Mental Status: She is alert and oriented to person, place, and time.  Psychiatric:        Mood and Affect: Mood normal.        Behavior: Behavior normal.      Assessment:   Pregnancy: [id number] Patient Active Problem List    Diagnosis Date Noted  . Supervision of low-risk pregnancy 07/16/2019  . Depression affecting pregnancy 07/16/2019  . Rh negative state in antepartum period 05/07/2019  . Abdominal pain in pregnancy 05/06/2019  . Pregnancy of unknown anatomic location 05/06/2019  . Thrombocytopenia ([place]) 05/06/2019     Plan:  1. Encounter for supervision of low-risk pregnancy in first trimester - Cytology - PAP( [place]) - Genetic Screening - CBC/D/Plt+RPR+Rh+ABO+Rub Ab...   Initial labs drawn. Continue prenatal vitamins. Genetic Screening discussed, AFP and NIPS: ordered. Ultrasound discussed; fetal anatomic survey: ordered. Problem list reviewed and updated. The nature of [facility] with multiple MDs and other Advanced Practice Providers was explained to patient; also emphasized that residents, students are part of our team. Routine obstetric precautions reviewed. 50% of 45 min visit spent in counseling and coordination of care. Return in about 4 weeks (around 08/21/2019).   Marcille Buffy DNP, CNM  07/24/19  12:01 PM

## 2019-07-25 LAB — CYTOLOGY - PAP
Chlamydia: NEGATIVE
Comment: NEGATIVE
Comment: NORMAL
Diagnosis: NEGATIVE
Neisseria Gonorrhea: NEGATIVE

## 2019-07-27 LAB — CBC/D/PLT+RPR+RH+ABO+RUB AB...
Basophils Absolute: 0 10*3/uL (ref 0.0–0.2)
Basos: 0 %
EOS (ABSOLUTE): 0 10*3/uL (ref 0.0–0.4)
Eos: 1 %
HCV Ab: <0.1 s/co ratio (ref 0.0–0.9)
HIV Screen 4th Generation wRfx: NONREACTIVE
Hematocrit: 40.7 % (ref 34.0–46.6)
Hemoglobin: 14.2 g/dL (ref 11.1–15.9)
Hepatitis B Surface Ag: NEGATIVE
Immature Grans (Abs): 0 10*3/uL (ref 0.0–0.1)
Immature Granulocytes: 0 %
Lymphocytes Absolute: 1.2 10*3/uL (ref 0.7–3.1)
Lymphs: 30 %
MCH: 31.1 pg (ref 26.6–33.0)
MCHC: 34.9 g/dL (ref 31.5–35.7)
MCV: 89 fL (ref 79–97)
Monocytes Absolute: 0.3 10*3/uL (ref 0.1–0.9)
Monocytes: 8 %
Neutrophils Absolute: 2.4 10*3/uL (ref 1.4–7.0)
Neutrophils: 61 %
Platelets: 108 10*3/uL — ABNORMAL LOW (ref 150–450)
RBC: 4.56 x10E6/uL (ref 3.77–5.28)
RDW: 12.3 % (ref 11.7–15.4)
RPR Ser Ql: NONREACTIVE
Rh Factor: NEGATIVE
Rubella Antibodies, IGG: 18.5 index (ref >0.99)
WBC: 4 10*3/uL (ref 3.4–10.8)

## 2019-07-27 LAB — AB SCR+ANTIBODY ID: Antibody Screen: POSITIVE — AB

## 2019-07-27 LAB — HCV INTERPRETATION

## 2019-07-28 ENCOUNTER — Inpatient Hospital Stay (HOSPITAL_COMMUNITY)
Admission: AD | Admit: 2019-07-28 | Discharge: 2019-07-29 | Disposition: A | Discharge status: Home / Self Care | Payer: PRIVATE HEALTH INSURANCE | Attending: Obstetrics & Gynecology | Admitting: Obstetrics & Gynecology

## 2019-07-28 ENCOUNTER — Other Ambulatory Visit: Payer: Self-pay

## 2019-07-28 ENCOUNTER — Encounter (HOSPITAL_COMMUNITY): Payer: Self-pay | Admitting: Obstetrics & Gynecology

## 2019-07-28 DIAGNOSIS — Z3A11 11 weeks gestation of pregnancy: Secondary | ICD-10-CM | POA: Diagnosis not present

## 2019-07-28 DIAGNOSIS — R109 Unspecified abdominal pain: Secondary | ICD-10-CM | POA: Diagnosis not present

## 2019-07-28 DIAGNOSIS — Z79899 Other long term (current) drug therapy: Secondary | ICD-10-CM | POA: Insufficient documentation

## 2019-07-28 DIAGNOSIS — O468X1 Other antepartum hemorrhage, first trimester: Secondary | ICD-10-CM | POA: Diagnosis not present

## 2019-07-28 DIAGNOSIS — O208 Other hemorrhage in early pregnancy: Secondary | ICD-10-CM | POA: Diagnosis not present

## 2019-07-28 DIAGNOSIS — Z8759 Personal history of other complications of pregnancy, childbirth and the puerperium: Secondary | ICD-10-CM | POA: Diagnosis not present

## 2019-07-28 DIAGNOSIS — R1030 Lower abdominal pain, unspecified: Secondary | ICD-10-CM | POA: Diagnosis not present

## 2019-07-28 DIAGNOSIS — O26891 Other specified pregnancy related conditions, first trimester: Secondary | ICD-10-CM | POA: Insufficient documentation

## 2019-07-28 DIAGNOSIS — O2341 Unspecified infection of urinary tract in pregnancy, first trimester: Secondary | ICD-10-CM | POA: Diagnosis not present

## 2019-07-28 DIAGNOSIS — Z3A10 10 weeks gestation of pregnancy: Secondary | ICD-10-CM | POA: Diagnosis not present

## 2019-07-28 HISTORY — DX: Bipolar disorder, unspecified: F31.9

## 2019-07-28 LAB — URINALYSIS, ROUTINE W REFLEX MICROSCOPIC
Bilirubin Urine: NEGATIVE
Glucose, UA: NEGATIVE mg/dL
Hgb urine dipstick: NEGATIVE
Ketones, ur: NEGATIVE mg/dL
Nitrite: NEGATIVE
Protein, ur: NEGATIVE mg/dL
Specific Gravity, Urine: 1.018 (ref 1.005–1.030)
pH: 6 (ref 5.0–8.0)

## 2019-07-28 MED ORDER — ACETAMINOPHEN 500 MG PO TABS
1000.0000 mg | ORAL_TABLET | Freq: Once | ORAL | Status: AC
  Administered 2019-07-28: 1000 mg via ORAL
  Filled 2019-07-28: qty 2

## 2019-07-28 NOTE — MAU Provider Note (Signed)
History     CSN: [id number]  Arrival date and time: 07/28/19 2226   First Provider Initiated Contact with Patient 07/28/19 2311      Chief Complaint  Patient presents with  . Abdominal Pain   HPI   Sally Haas is a 24 y.o. female [phone] @ 22w5dhere with lower abdominal pain that started today. Says when she got in her car and sat down she felt like sharp/ shooting pain that feels like a knife stabbing her. States the pain feels like her ectopic pregnancy felt when she had that. Recent [place] confirmed IUP. No new nausea and vomiting. No fever. The pain comes and goes. She has not taken anything for the pain. She is currently taking amoxicilian for a UTI. She has no flank pain or fever. No nausea or vomiting.    OB History    Gravida  4   Para  1   Term  1   Preterm      AB  2   Living  1     SAB  1   TAB      Ectopic  1   Multiple      Live Births  1        Obstetric Comments  G1: early SAB G2: TSVD 2019         Past Medical History:  Diagnosis Date  . Bipolar disease, chronic ([facility]   . Ectopic pregnancy   . Kidney stone   . Thrombocytopenia ([facility]     Past Surgical History:  Procedure Laterality Date  . DIAGNOSTIC LAPAROSCOPY WITH REMOVAL OF ECTOPIC PREGNANCY Right 05/07/2019   Procedure: Laparoscopic Removal Of Ectopic Pregnancy;  Surgeon: PDonnamae Jude MD;  [facility]  Service: [facility];  Laterality: Right;  . LAPAROSCOPIC CHOLECYSTECTOMY      Family History  Problem Relation Age of Onset  . Alcohol abuse Father     Social History   Tobacco Use  . Smoking status: Never Smoker  . Smokeless tobacco: Never Used  Vaping Use  . Vaping Use: Never used  Substance Use Topics  . Alcohol use: Never  . Drug use: Never    Allergies: No Known Allergies  Medications Prior to Admission  Medication Sig Dispense Refill Last Dose  . amoxicillin (AMOXIL) 500 MG capsule Take 1 capsule (500 mg total) by mouth 3  (three) times daily. 21 capsule 0 07/28/2019 at Unknown time  . lamoTRIgine (LAMICTAL) 25 MG tablet Take 2 tablets (50 mg total) by mouth daily. 60 tablet 0 Past Week at Unknown time  . Blood Pressure Monitoring (BLOOD PRESSURE KIT) DEVI 1 Device by Does not apply route as needed. 1 each 0   . metoCLOPramide (REGLAN) 10 MG tablet Take 1 tablet (10 mg total) by mouth 3 (three) times daily with meals as needed for nausea. 30 tablet 3   . ondansetron (ZOFRAN ODT) 4 MG disintegrating tablet Take 1 tablet (4 mg total) by mouth every 6 (six) hours as needed for nausea. (Patient not taking: Reported on 07/16/2019) 20 tablet 0   . pantoprazole (PROTONIX) 20 MG tablet Take 1 tablet (20 mg total) by mouth daily. (Patient not taking: Reported on 07/24/2019) 30 tablet 1   . promethazine (PHENERGAN) 25 MG tablet Take 0.5-1 tablets (12.5-25 mg total) by mouth every 6 (six) hours as needed for nausea. 30 tablet 2   . scopolamine (TRANSDERM-SCOP, 1.5 MG,) 1 MG/3DAYS Place 1 patch (1.5 mg total) onto  the skin every 3 (three) days. (Patient not taking: Reported on 07/24/2019) 10 patch 12    Results for orders placed or performed during the hospital encounter of 07/28/19 (from the past 48 hour(s))  Urinalysis, Routine w reflex microscopic     Status: Abnormal   Collection Time: 07/28/19 10:47 PM  Result Value Ref Range   Color, Urine YELLOW YELLOW   APPearance CLOUDY (A) CLEAR   Specific Gravity, Urine 1.018 1.005 - 1.030   pH 6.0 5.0 - 8.0   Glucose, UA NEGATIVE NEGATIVE mg/dL   Hgb urine dipstick NEGATIVE NEGATIVE   Bilirubin Urine NEGATIVE NEGATIVE   Ketones, ur NEGATIVE NEGATIVE mg/dL   Protein, ur NEGATIVE NEGATIVE mg/dL   Nitrite NEGATIVE NEGATIVE   Leukocytes,Ua MODERATE (A) NEGATIVE   RBC / HPF 0-5 0 - 5 RBC/hpf   WBC, UA 6-10 0 - 5 WBC/hpf   Bacteria, UA RARE (A) NONE SEEN   Squamous Epithelial / LPF 0-5 0 - 5   Mucus PRESENT    Amorphous Crystal PRESENT     Comment: Performed at [facility], [address], [place], [place] [address]  CBC with Differential/Platelet     Status: Abnormal   Collection Time: 07/28/19 11:30 PM  Result Value Ref Range   WBC 4.4 4.0 - 10.5 K/uL   RBC 3.98 3.87 - 5.11 MIL/uL   Hemoglobin 12.2 12.0 - 15.0 g/dL   HCT 35.1 (L) 36 - 46 %   MCV 88.2 80.0 - 100.0 fL   MCH 30.7 26.0 - 34.0 pg   MCHC 34.8 30.0 - 36.0 g/dL   RDW 11.8 11.5 - 15.5 %   Platelets 98 (L) 150 - 400 K/uL    Comment: SPECIMEN CHECKED FOR CLOTS Immature Platelet Fraction may be clinically indicated, consider ordering this additional test [id number] PLATELET COUNT CONFIRMED BY SMEAR REPEATED TO VERIFY    nRBC 0.0 0.0 - 0.2 %   Neutrophils Relative % 57 %   Neutro Abs 2.5 1.7 - 7.7 K/uL   Lymphocytes Relative 32 %   Lymphs Abs 1.4 0.7 - 4.0 K/uL   Monocytes Relative 9 %   Monocytes Absolute 0.4 0 - 1 K/uL   Eosinophils Relative 1 %   Eosinophils Absolute 0.0 0 - 0 K/uL   Basophils Relative 1 %   Basophils Absolute 0.0 0 - 0 K/uL   Immature Granulocytes 0 %   Abs Immature Granulocytes 0.01 0.00 - 0.07 K/uL    Comment: Performed at [facility], [place] [address] [place], [place] [address]  Comprehensive metabolic panel     Status: Abnormal   Collection Time: 07/28/19 11:30 PM  Result Value Ref Range   Sodium 137 135 - 145 mmol/L   Potassium 3.6 3.5 - 5.1 mmol/L   Chloride 106 98 - 111 mmol/L   CO2 23 22 - 32 mmol/L   Glucose, Bld 92 70 - 99 mg/dL    Comment: Glucose reference range applies only to samples taken after fasting for at least 8 hours.   BUN 6 6 - 20 mg/dL   Creatinine, Ser 0.47 0.44 - 1.00 mg/dL   Calcium 9.0 8.9 - 10.3 mg/dL   Total Protein 5.9 (L) 6.5 - 8.1 g/dL   Albumin 3.4 (L) 3.5 - 5.0 g/dL   AST 15 15 - 41 U/L   ALT 20 0 - 44 U/L   Alkaline Phosphatase 38 38 - 126 U/L   Total Bilirubin 0.4 0.3 - 1.2  mg/dL   GFR calc non Af Amer >60 >60 mL/min   GFR calc Af Amer >60 >60 mL/min   Anion gap 8 5 - 15    Comment: Performed at [facility] [address] [place], [place] [address]   [place] Transvaginal  Result Date: 07/29/2019 CLINICAL DATA:  Right lower quadrant pain. EXAM: OBSTETRIC <14 WK [place] AND TRANSVAGINAL OB US TECHNIQUE: Both transabdominal and transvaginal ultrasound examinations were performed for complete evaluation of the gestation as well as the maternal uterus, adnexal regions, and pelvic cul-de-sac. Transvaginal technique was performed to assess early pregnancy. COMPARISON:  None. FINDINGS: Intrauterine gestational sac: Single Yolk sac:  Visualized. Embryo:  Visualized. Cardiac Activity: Visualized. Heart Rate: 177 bpm CRL:  47.2 mm   11 w   4 d                  [place] EDC: February 13, 2020 Subchorionic hemorrhage:  A small subchorionic hemorrhage is seen. Maternal uterus/adnexae: The ovaries are visualized and are normal in appearance. No pelvic Haas fluid is seen. IMPRESSION: A single, viable intrauterine pregnancy at approximately 11 weeks and 4 days gestation by ultrasound evaluation. Electronically Signed   By: Virgina Norfolk M.D.   On: 07/29/2019 02:11   US RENAL  Result Date: 07/29/2019 CLINICAL DATA:  Right lower quadrant pain. EXAM: RENAL / URINARY TRACT ULTRASOUND COMPLETE COMPARISON:  None. FINDINGS: Right Kidney: Renal measurements: 13.5 cm x 4.9 cm x 6.0 cm = volume: 207.1 mL . Echogenicity within normal limits. No mass or hydronephrosis visualized. Left Kidney: Renal measurements: 12.2 cm x 5.6 cm x 5.6 cm = volume: 199.3 mL. Echogenicity within normal limits. No mass or hydronephrosis visualized. Bladder: Appears normal for degree of bladder distention. Bilateral ureteral jets are visualized. Other: None. IMPRESSION: Normal renal ultrasound. Electronically Signed   By: Virgina Norfolk M.D.   On: 07/29/2019 02:05   Review of Systems  Constitutional: Negative for fever.  Gastrointestinal: Positive for abdominal pain and nausea. Negative for constipation, diarrhea and vomiting.  Genitourinary: Negative  for dysuria and flank pain.   Physical Exam   Blood pressure 119/64, pulse 77, temperature 98.2 F (36.8 C), resp. rate 16, height _0  (1.651 m), weight 79.4 kg, SpO2 100 %.  Physical Exam Constitutional:      Appearance: She is well-developed and normal weight.  HENT:     Head: Normocephalic.  Eyes:     Extraocular Movements: Extraocular movements intact.  Abdominal:     Tenderness: There is abdominal tenderness (Generalized tenderness.). There is no right CVA tenderness, left CVA tenderness or rebound.  Musculoskeletal:        General: Normal range of motion.  Skin:    General: Skin is warm.  Neurological:     Mental Status: She is alert and oriented to person, place, and time.  Psychiatric:        Mood and Affect: Mood normal.    MAU Course  Procedures  None  MDM  + fetal heart tones via doppler  Negative GC/Chlamydia on 7/6 Tylenol 1 gram given, pain down to 5/10 from 7/10 US done.  Renal US negative. Urine culture pending.  Ibuprofen 600 mg given PO prior to DC Patient feeling significantly better and feels ok about going home.   Assessment and Plan   A:  1. Subchorionic hematoma in first trimester, single or unspecified fetus   2. Abdominal pain in pregnancy   3. Abdominal cramping affecting pregnancy  4. Urinary tract infection in mother during first trimester of pregnancy     P:  Discharge home in stable condition Return to MAU if symptoms worsen  Continue Amox., urine culture pending Discussed [place] in detail with patient  Lezlie Lye, NP 07/30/2019 5:23 PM

## 2019-07-28 NOTE — MAU Note (Signed)
Pt reports sharp RLQ pain that was intermittent earlier today and they started to be more constant and more intense this evening.  Denies any vag bleeding.Reports yellow vag discharge with an odor ofr a few days

## 2019-07-29 ENCOUNTER — Inpatient Hospital Stay (HOSPITAL_COMMUNITY): Payer: PRIVATE HEALTH INSURANCE

## 2019-07-29 DIAGNOSIS — O468X1 Other antepartum hemorrhage, first trimester: Secondary | ICD-10-CM | POA: Diagnosis not present

## 2019-07-29 DIAGNOSIS — O26891 Other specified pregnancy related conditions, first trimester: Secondary | ICD-10-CM | POA: Diagnosis not present

## 2019-07-29 DIAGNOSIS — Z3A10 10 weeks gestation of pregnancy: Secondary | ICD-10-CM

## 2019-07-29 DIAGNOSIS — R109 Unspecified abdominal pain: Secondary | ICD-10-CM

## 2019-07-29 LAB — CBC WITH DIFFERENTIAL/PLATELET
Abs Immature Granulocytes: 0.01 10*3/uL (ref 0.00–0.07)
Basophils Absolute: 0 10*3/uL (ref 0.0–0.1)
Basophils Relative: 1 %
Eosinophils Absolute: 0 10*3/uL (ref 0.0–0.5)
Eosinophils Relative: 1 %
HCT: 35.1 % — ABNORMAL LOW (ref 36.0–46.0)
Hemoglobin: 12.2 g/dL (ref 12.0–15.0)
Immature Granulocytes: 0 %
Lymphocytes Relative: 32 %
Lymphs Abs: 1.4 10*3/uL (ref 0.7–4.0)
MCH: 30.7 pg (ref 26.0–34.0)
MCHC: 34.8 g/dL (ref 30.0–36.0)
MCV: 88.2 fL (ref 80.0–100.0)
Monocytes Absolute: 0.4 10*3/uL (ref 0.1–1.0)
Monocytes Relative: 9 %
Neutro Abs: 2.5 10*3/uL (ref 1.7–7.7)
Neutrophils Relative %: 57 %
Platelets: 98 10*3/uL — ABNORMAL LOW (ref 150–400)
RBC: 3.98 MIL/uL (ref 3.87–5.11)
RDW: 11.8 % (ref 11.5–15.5)
WBC: 4.4 10*3/uL (ref 4.0–10.5)
nRBC: 0 % (ref 0.0–0.2)

## 2019-07-29 LAB — COMPREHENSIVE METABOLIC PANEL
ALT: 20 U/L (ref 0–44)
AST: 15 U/L (ref 15–41)
Albumin: 3.4 g/dL — ABNORMAL LOW (ref 3.5–5.0)
Alkaline Phosphatase: 38 U/L (ref 38–126)
Anion gap: 8 (ref 5–15)
BUN: 6 mg/dL (ref 6–20)
CO2: 23 mmol/L (ref 22–32)
Calcium: 9 mg/dL (ref 8.9–10.3)
Chloride: 106 mmol/L (ref 98–111)
Creatinine, Ser: 0.47 mg/dL (ref 0.44–1.00)
GFR calc Af Amer: >60 mL/min (ref >60)
GFR calc non Af Amer: >60 mL/min (ref >60)
Glucose, Bld: 92 mg/dL (ref 70–99)
Potassium: 3.6 mmol/L (ref 3.5–5.1)
Sodium: 137 mmol/L (ref 135–145)
Total Bilirubin: 0.4 mg/dL (ref 0.3–1.2)
Total Protein: 5.9 g/dL — ABNORMAL LOW (ref 6.5–8.1)

## 2019-07-29 MED ORDER — IBUPROFEN 600 MG PO TABS
600.0000 mg | ORAL_TABLET | Freq: Once | ORAL | Status: AC
  Administered 2019-07-29: 600 mg via ORAL
  Filled 2019-07-29: qty 1

## 2019-07-29 NOTE — Discharge Instructions (Signed)

## 2019-07-30 LAB — CULTURE, OB URINE: Culture: <10000 — AB

## 2019-08-01 ENCOUNTER — Encounter: Payer: Self-pay | Admitting: Advanced Practice Midwife

## 2019-08-01 DIAGNOSIS — Z3182 Encounter for Rh incompatibility status: Secondary | ICD-10-CM | POA: Insufficient documentation

## 2019-08-06 NOTE — BH Specialist Note (Signed)
Integrated Behavioral Health via Telemedicine Video (Caregility) Visit  08/06/2019 [name] [id number]  Number of Integrated Behavioral Health visits: 4 Session Start time: 1:17  Session End time: 1:45 Total time: 28  Referring Provider: Tinnie Gens, MD Type of Visit: Video Patient/Family location: Home [facility] Provider location: [facility]  All persons participating in visit: [name] and [facility] Rebbecca Osuna    Reighn Kaplan C Romaine Neville   Confirmed patient's address: Yes  Confirmed patient's phone number: Yes  Any changes to demographics: No   Confirmed patient's insurance: Yes  Any changes to patient's insurance: No   Discussed confidentiality: at previous visit  I connected with Selinda Michaels  by a video enabled telemedicine application (Caregility) and verified that I am speaking with the correct person using two identifiers.     I discussed the limitations of evaluation and management by telemedicine and the availability of in person appointments.  I discussed that the purpose of this visit is to provide behavioral health care while limiting exposure to the novel coronavirus.   Discussed there is a possibility of technology failure and discussed alternative modes of communication if that failure occurs.  I discussed that engaging in this virtual visit, they consent to the provision of behavioral healthcare and the services will be billed under their insurance.  Patient and/or legal guardian expressed understanding and consented to virtual visit: Yes   PRESENTING CONCERNS: Patient and/or family reports the following symptoms/concerns: Pt states she is feeling better after making some big life decisions (will temporarily move to [place] to spend time/care for father, enjoy outdoors, and leave stressful job behind); pt feeling somewhat anxious about uncertainty of possibly transferring care to a new location.   Duration of problem: Increase in current pregnancy ; Severity of problem: moderate  STRENGTHS (Protective Factors/Coping Skills): Supportive partner and family  GOALS ADDRESSED: Patient will: 1.  Reduce symptoms of: anxiety, depression and mood instability  2.  Increase knowledge and/or ability of: healthy habits  3.  Demonstrate ability to: Increase healthy adjustment to current life circumstances  INTERVENTIONS: Interventions utilized:  Solution-Focused Strategies Standardized Assessments completed: GAD-7 and PHQ 9  ASSESSMENT: Patient currently experiencing Bipolar 2 disorder and Generalized anxiety disorder (both as previously diagnosed via psychiatry).   Patient may benefit from continued psychoeducation and brief therapeutic interventions regarding coping with symptoms of anxiety, depression and life stress .  PLAN: 1. Follow up with behavioral health clinician on : As needed 2. Behavioral recommendations:  -Continue attending psychiatry appointments; discuss move to [place] with psychiatrist at upcoming appointment (and need for medication refills prior to establishing care in [place], if needed)  -Discuss with medical provider move to [place] at upcoming medical appointment (and need for medication refills prior to establishing care in [place], if needed)   -Read Postpartum Planner on After Visit Summary and use any helpful information (keep in mind that [email or web address].net has perinatal and postpartum free virtual support groups you may attend nationwide) -After move, consider finding the best outdoor spaces for daily walks, along with establishing care with a new ob/gyn practice and psychiatry, as needed  3. Referral(s): Integrated Art gallery manager (In Clinic) and [employer]:  Perinatal/postpartum support groups  I discussed the assessment and treatment plan with the patient and/or parent/guardian. They were provided an opportunity to ask questions and all were answered.  They agreed with the plan and demonstrated an understanding of the instructions.   They were advised  to call back or seek an in-person evaluation if the symptoms worsen or if the condition fails to improve as anticipated.  Sally Haas Sally Haas  Depression screen [facility] 2/9 08/07/2019 07/24/2019 07/16/2019 06/26/2019 05/31/2019  Decreased Interest 0 0 0 3 1  Down, Depressed, Hopeless 0 0 0 3 1  PHQ - 2 Score 0 0 0 6 2  Altered sleeping 0 0 0 3 2  Tired, decreased energy 3 0 3 3 3   Change in appetite 3 0 3 3 3   Feeling bad or failure about yourself  0 0 0 3 1  Trouble concentrating 0 0 0 3 0  Moving slowly or fidgety/restless 0 0 0 1 0  Suicidal thoughts 0 0 1 0 0  PHQ-9 Score 6 0 7 22 11   Difficult doing work/chores - - - - -   GAD 7 : Generalized Anxiety Score 08/07/2019 07/24/2019 07/16/2019 06/26/2019  Nervous, Anxious, on Edge 1 0 1 3  Control/stop worrying 0 0 0 3  Worry too much - different things 0 0 0 3  Trouble relaxing 0 0 0 3  Restless 0 0 0 3  Easily annoyed or irritable 3 0 2 3  Afraid - awful might happen 0 0 0 3  Total GAD 7 Score 4 0 3 21

## 2019-08-07 ENCOUNTER — Ambulatory Visit (INDEPENDENT_AMBULATORY_CARE_PROVIDER_SITE_OTHER): Payer: PRIVATE HEALTH INSURANCE | Admitting: Clinical

## 2019-08-07 ENCOUNTER — Other Ambulatory Visit: Payer: Self-pay

## 2019-08-07 DIAGNOSIS — F3181 Bipolar II disorder: Secondary | ICD-10-CM | POA: Diagnosis not present

## 2019-08-07 DIAGNOSIS — F411 Generalized anxiety disorder: Secondary | ICD-10-CM

## 2019-08-07 DIAGNOSIS — O99340 Other mental disorders complicating pregnancy, unspecified trimester: Secondary | ICD-10-CM

## 2019-08-07 NOTE — Patient Instructions (Signed)
[facility] [place], [place] [address] [phone] (main office) [phone] ([place] office)     BRAINSTORMING  Develop a Plan Goals: . Provide a way to start conversation about your new life with a baby . Assist parents in recognizing and using resources within their reach . Help pave the way before birth for an easier period of transition afterwards.  Make a list of the following information to keep in a central location: . Full name of Mom and Partner: _____________________________________________ . 82 full name and Date of Birth: ___________________________________________ . Home Address: ___________________________________________________________ ________________________________________________________________________ . Home Phone: ____________________________________________________________ . Parents' cell numbers: _____________________________________________________ ________________________________________________________________________ . Name and contact info for OB: ______________________________________________ . Name and contact info for Pediatrician:________________________________________ . Contact info for Lactation Consultants: ________________________________________  REST and SLEEP *You each need at least 4-5 hours of uninterrupted sleep every day. Write specific names and contact information.* . How are you going to rest in the postpartum period? While partner's home? When partner returns to work? When you both return to work? Marland Kitchen Where will your baby sleep? Marland Kitchen Who is available to help during the day? Evening? Night? . Who could move in for a period to help support you? Marland Kitchen What are some ideas to help you get enough  sleep? __________________________________________________________________________________________________________________________________________________________________________________________________________________________________________ NUTRITIOUS FOOD AND DRINK *Plan for meals before your baby is born so you can have healthy food to eat during the immediate postpartum period.* . Who will look after breakfast? Lunch? Dinner? List names and contact information. Brainstorm quick, healthy ideas for each meal. . What can you do before baby is born to prepare meals for the postpartum period? . How can others help you with meals? Marland Kitchen Which grocery stores provide online shopping and delivery? Marland Kitchen Which restaurants offer take-out or delivery options? ______________________________________________________________________________________________________________________________________________________________________________________________________________________________________________________________________________________________________________________________________________________________________________________________________  CARE FOR MOM *It's important that mom is cared for and pampered in the postpartum period. Remember, the most important ways new mothers need care are: sleep, nutrition, gentle exercise, and time off.* . Who can come take care of mom during this period? Make a list of people with their contact information. . List some activities that make you feel cared for, rested, and energized? Who can make sure you have opportunities to do these things? . Does mom have a space of her very own within your home that's just for her? Make a "[facility]" where she can be comfortable, rest, and renew herself  daily. ______________________________________________________________________________________________________________________________________________________________________________________________________________________________________________________________________________________________________________________________________________________________________________________________________    CARE FOR AND FEEDING BABY *Knowledgeable and encouraging people will offer the best support with regard to feeding your baby.* . Educate yourself and choose the best feeding option for your baby. . Make a list of people who will guide, support, and be a resource for you as your care for and feed your baby. (Friends that have breastfed or are currently breastfeeding, lactation consultants, breastfeeding support groups, etc.) . Consider a postpartum doula. (These websites can give you information: dona.org & BuyingShow.es) . Seek out local breastfeeding resources like the breastfeeding support group at [employer] or [employer]. ______________________________________________________________________________________________________________________________________________________________________________________________________________________________________________________________________________________________________________________________________________________________________________________________________  Verner Chol AND ERRANDS . Who can help with a thorough cleaning before baby is born? . Make a list of people who will help with housekeeping and chores, like laundry, light cleaning, dishes, bathrooms, etc. . Who can run some errands for you? Marland Kitchen What can you do to make sure you are stocked with basic supplies before baby is born? . Who is going to do the  shopping? ______________________________________________________________________________________________________________________________________________________________________________________________________________________________________________________________________________________________________________________________________________________________________________________________________     Family Adjustment *Nurture yourselves.it helps parents be more loving and allows for better bonding with their child.* . What sorts of things do you and partner enjoy doing together? Which activities help you to connect and strengthen your relationship? Make a list of those things. Make a list of people whom you trust to care for your baby so you can have some time together as a couple. . What types of things help partner feel connected to Mom? Make a list. . What needs will partner have in order to bond with baby? . Other children? Who will care for them when you go into labor and while you are in the hospital? . Think about what the needs of your older children might be. Who can help you meet those needs? In what ways are you helping them prepare for bringing baby home? List some specific strategies you have for family adjustment. _______________________________________________________________________________________________________________________________________________________________________________________________________________________________________________________________________________________________________________________________________________  SUPPORT *Someone who can empathize with experiences normalizes your problems and makes them more bearable.* . Make a list of other friends, neighbors, and/or co-workers you know with infants (and small children, if applicable) with whom you can connect. . Make a list of local or online support groups, mom groups, etc. in which you can be  involved. ______________________________________________________________________________________________________________________________________________________________________________________________________________________________________________________________________________________________________________________________________________________________________________________________________  Childcare Plans . Investigate and plan for childcare if mom is returning to work. . Talk about mom's concerns about her transition back to work. . Talk about partner's concerns regarding this transition.  Mental Health *Your mental health is one of the highest priorities for a pregnant or postpartum mom.* . 1 in 5 women experience anxiety and/or depression from the time of conception through the first year after birth. . Postpartum Mood Disorders are the #1 complication of pregnancy and childbirth and the suffering experienced by these mothers is not necessary! These illnesses are temporary and respond well to treatment, which often includes self-care, social support, talk therapy, and medication when needed. . Women experiencing anxiety and depression often say things like: "I'm supposed to be happy.why do I feel so sad?", "Why can't I snap out of it?", "I'm having thoughts that scare me." . There is no need to be embarrassed if you are feeling these symptoms: o Overwhelmed, anxious, angry, sad, guilty, irritable, hopeless, exhausted but can't sleep o You are NOT alone. You are NOT to blame. With help, you WILL be well. . Where can I find help? Medical professionals such as your OB, midwife, gynecologist, family practitioner, primary care provider, pediatrician, or mental health providers; [facility] support groups: Feelings After Birth, Breastfeeding Support Group, Baby and Me Group, and Fit 4 Two exercise classes. . You have permission to ask for help. It will confirm your feelings, validate your  experiences, share/learn coping strategies, and gain support and encouragement as you heal. You are important! BRAINSTORM . Make a list of local resources, including resources for mom and for partner. . Identify support groups. . Identify people to call late at night - include names and contact info. . Talk with partner about perinatal mood and anxiety disorders. . Talk with your OB, midwife, and doula about baby blues and about perinatal mood and anxiety disorders. . Talk with your pediatrician about perinatal mood and anxiety disorders.   Support & Sanity Savers   What do you really need?  . Basics . In preparing for a new baby, many expectant parents spend hours shopping for baby clothes, decorating the nursery, and deciding which car seat to   buy. Yet most don't think much about what the reality of parenting a newborn will be like, and what they need to make it through that. So, here is the advice of experienced parents. We know you'll read this, and think "they're exaggerating, I don't really need that." Just trust [place] on these, OK? Plan for all of this, and if it turns out you don't need it, come back and teach [place] how you did it!  Satira Anis (Once baby's survival needs are met, make sure you attend to your own survival needs!) . Sleep . An average newborn sleeps 16-18 hours per day, over 6-7 sleep periods, rarely more than three hours at a time. It is normal and healthy for a newborn to wake throughout the night... but really hard on parents!! . Naps. Prioritize sleep above any responsibilities like: cleaning house, visiting friends, running errands, etc.  Sleep whenever baby sleeps. If you can't nap, at least have restful times when baby eats. The more rest you get, the more patient you will be, the more emotionally stable, and better at solving problems.  . Food . You may not have realized it would be difficult to eat when you have a newborn. Yet, when we talk to . countless new  parents, they say things like "it may be 2:00 pm when I realize I haven't had breakfast yet." Or "every time we sit down to dinner, baby needs to eat, and my food gets cold, so I don't bother to eat it." . Finger food. Before your baby is born, stock up with one months' worth of food that: 1) you can eat with one hand while holding a baby, 2) doesn't need to be prepped, 3) is good hot or cold, 4) doesn't spoil when left out for a few hours, and 5) you like to eat. Think about: nuts, dried fruit, Clif bars, pretzels, jerky, gogurt, baby carrots, apples, bananas, crackers, cheez-n-crackers, string cheese, hot pockets or frozen burritos to microwave, garden burgers and breakfast pastries to put in the toaster, yogurt drinks, etc. . Restaurant Menus. Make lists of your favorite restaurants & menu items. When family/friends want to help, you can give specific information without much thought. They can either bring you the food or send gift cards for just the right meals. Rosaura Carpenter Meals.  Take some time to make a few meals to put in the freezer ahead of time.  Easy to freeze meals can be anything such as soup, lasagna, chicken pie, or spaghetti sauce. . Set up a Meal Schedule.  Ask friends and family to sign up to bring you meals during the first few weeks of being home. (It can be passed around at baby showers!) You have no idea how helpful this will be until you are in the throes of parenting.  https://hamilton-woodard.com/ is a great website to check out. . Emotional Support . Know who to call when you're stressed out. Parenting a newborn is very challenging work. There are times when it totally overwhelms your normal coping abilities. EVERY NEW PARENT NEEDS TO HAVE A PLAN FOR WHO TO CALL WHEN THEY JUST CAN'T COPE ANY MORE. (And it has to be someone other than the baby's other parent!) Before your baby is born, come up with at least one person you can call for support - write their phone number down and post it on the  refrigerator. Marland Kitchen Anxiety & Sadness. Baby blues are normal after pregnancy; however, there are more severe types of anxiety &  sadness which can occur and should not be ignored.  They are always treatable, but you have to take the first step by reaching out for help. [facility] offers a "Mom Talk" group which meets every Tuesday from 10 am - 11 am.  This group is for new moms who need support and connection after their babies are born.  Call [phone].  Marland Kitchen Really, Really Helpful (Plan for them! Make sure these happen often!!) . Physical Support with Taking Care of Yourselves . Asking friends and family. Before your baby is born, set up a schedule of people who can come and visit and help out (or ask a friend to schedule for you). Any time someone says "let me know what I can do to help," sign them up for a day. When they get there, their job is not to take care of the baby (that's your job and your joy). Their job is to take care of you!  . Postpartum doulas. If you don't have anyone you can call on for support, look into postpartum doulas:  professionals at helping parents with caring for baby, caring for themselves, getting breastfeeding started, and helping with household tasks. www.padanc.org is a helpful website for learning about doulas in our area. . Peer Support / Parent Groups . Why: One of the greatest ideas for new parents is to be around other new parents. Parent groups give you a chance to share and listen to others who are going through the same season of life, get a sense of what is normal infant development by watching several babies learn and grow, share your stories of triumph and struggles with empathetic ears, and forgive your own mistakes when you realize all parents are learning by trial and error. . Where to find: There are many places you can meet other new parents throughout our community.  [facility] offers the following classes for new moms and their little ones:  Baby  and Me (Birth to [place]) and Breastfeeding Support Group. Go to www.conehealthybaby.com or call [phone] for more information. . Time for your Relationship . It's easy to get so caught up in meeting baby's immediate needs that it's hard to find time to connect with your partner, and meet the needs of your relationship. It's also easy to forget what "quality time with your partner" actually looks like. If you take your baby on a date, you'd be amazed how much of your couple time is spent feeding the baby, diapering the baby, admiring the baby, and talking about the baby. . Dating: Try to take time for just the two of you. Babysitter tip: Sometimes when moms are breastfeeding a newborn, they find it hard to figure out how to schedule outings around baby's unpredictable feeding schedules. Have the babysitter come for a three hour period. When she comes over, if baby has just eaten, you can leave right away, and come back in two hours. If baby hasn't fed recently, you start the date at home. Once baby gets hungry and gets a good feeding in, you can head out for the rest of your date time. . Date Nights at Home: If you can't get out, at least set aside one evening a week to prioritize your relationship: whenever baby dozes off or doesn't have any immediate needs, spend a little time focusing on each other. . Potential conflicts: The main relationship conflicts that come up for new parents are: issues related to sexuality, financial stresses, a feeling of an unfair division  of household tasks, and conflicts in parenting styles. The more you can work on these issues before baby arrives, the better!  . Fun and Frills (Don't forget these. and don't feel guilty for indulging in them!) . Everyone has something in life that is a fun little treat that they do just for themselves. It may be: reading the morning paper, or going for a daily jog, or having coffee with a friend once a week, or going to a movie on Friday  nights, or fine chocolates, or bubble baths, or curling up with a good book. . Unless you do fun things for yourself every now and then, it's hard to have the energy for fun with your baby. Whatever your "special" treats are, make sure you find a way to continue to indulge in them after your baby is born. These special moments can recharge you, and allow you to return to baby with a new joy   PERINATAL MOOD DISORDERS: MATERNAL MENTAL HEALTH FROM CONCEPTION THROUGH THE POSTPARTUM PERIOD   Emergency and Crisis Resources:  If you are an imminent risk to self or others, are experiencing intense personal distress, and/or have noticed significant changes in activities of daily living, call:  . 911 . [facility]: [phone] . Mobile Crisis: 877-626-1772 . National Suicide Hotline: 1-800-273-8255 Or visit the following crisis centers: . Local Emergency Departments . [facility]: [address], [place] [phone]. Hours: 8:30AM-5PM. Insurance Accepted: Medicaid, Medicare, and Uninsured.  . [facility]  [address], [place] Mon-Friday 8am-3pm  [phone]                                                                                    Non-Crisis Resources: To identify specific providers that are covered by your insurance, contact your insurance company or local agencies: [facility]: 1-800-256-2452 [facility]: 888-581-9988 [employer]: 1-800-939-5911 Postpartum Support [employer]- [place] 1-800-944-4773                                                      Outpatient therapy and medication management providers:  [facility] [phone] Hours: 9AM-5PM  Insurance Accepted: AARP, Aetna, BCBS, Cigna, [employer], Humana, Medicare  [facility] [phone] Hours: 8AM-5PM  nsurance Accepted: All insurances EXCEPT AARP, Aetna, [employer], and  [employer] [facility]: [phone]             Hours: 8AM-8PM Insurance Accepted: Aetna, BCBS, Cigna, [employer], Medicaid, Medicare, Uninsured [facility]: [phone] [facility]: [phone] Hours: 8:30AM-7PM Insurance Accepted: Aetna, BCBS, Medicaid, Medicare, Tricare, [facility]:  [phone]              Hours:9AM-5PM Insurance Accepted:  Aetna, BCBS, [facility], Medicaid, United Health Care  [facility] [phone] Hours: 9AM-5:30PM Insurance Accepted: AARP, Aetna, BCBS, Cigna, and Medicaid, Medicare, United Health Care [facility]:  [phone] Hours: 9am-5pm Insurance Accepted: BCBS; they do not accept Medicaid/Medicare   [facility]: [phone] Hours: 9am-9pm Insurance Accepted: All major insurance including Medicaid and Medicare [facility]: [phone] Hours: 9AM-5:30PM Insurance Accepted: All insurances EXCEPT Medicaid and Medicare. [facility]: [phone]                                                                       Parenting Support Groups [facility]: [phone] [facility]:  [phone] Family Support Network (support for children in the NICU and/or with special needs), [phone]                                                                   Mental Health Support Groups Mental Health Association: [phone]                                                                                     Online Resources: Postpartum Support International: http://www.postpartum.net/  800-944-4PPD 2Moms Supporting Moms:  www.momssupportingmoms.net     

## 2019-08-16 ENCOUNTER — Encounter: Payer: PRIVATE HEALTH INSURANCE | Admitting: Obstetrics and Gynecology

## 2019-08-16 ENCOUNTER — Encounter: Payer: Self-pay | Admitting: Obstetrics and Gynecology

## 2019-08-20 ENCOUNTER — Telehealth (HOSPITAL_COMMUNITY): Payer: PRIVATE HEALTH INSURANCE | Admitting: Psychiatry

## 2019-08-27 ENCOUNTER — Encounter: Payer: Self-pay | Admitting: *Deleted

## 2019-09-06 ENCOUNTER — Encounter: Payer: Self-pay | Admitting: General Practice

## 2019-09-25 ENCOUNTER — Other Ambulatory Visit: Payer: PRIVATE HEALTH INSURANCE

## 2019-09-27 ENCOUNTER — Ambulatory Visit: Payer: PRIVATE HEALTH INSURANCE | Attending: Advanced Practice Midwife

## 2019-12-25 DIAGNOSIS — R9431 Abnormal electrocardiogram [ECG] [EKG]: Secondary | ICD-10-CM | POA: Diagnosis not present

## 2019-12-25 DIAGNOSIS — R000 Tachycardia, unspecified: Secondary | ICD-10-CM | POA: Diagnosis not present

## 2020-11-22 IMAGING — US US OB TRANSVAGINAL
1 series · 15 of 27 positions shown · non-contrast
Comparison: None.

CLINICAL DATA: Right lower quadrant pain.

EXAM:
OBSTETRIC <14 WK US AND TRANSVAGINAL OB US
TECHNIQUE: Both transabdominal and transvaginal ultrasound examinations were
performed for complete evaluation of the gestation as well as the
maternal uterus, adnexal regions, and pelvic cul-de-sac.
Transvaginal technique was performed to assess early pregnancy.

[Series 1: us ob transvaginal · 15 of 27 slices shown]
[im 1/27]
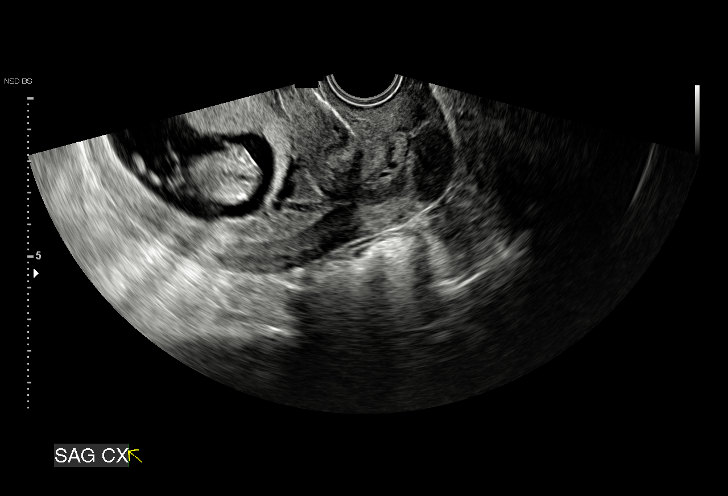
[im 3/27]
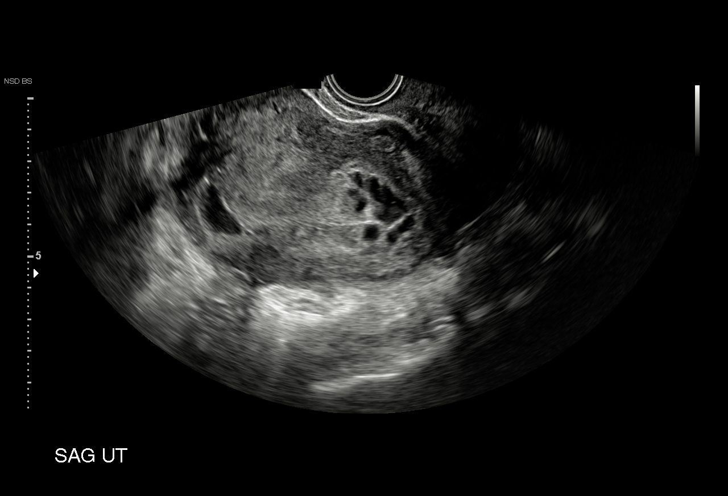
[im 5/27]
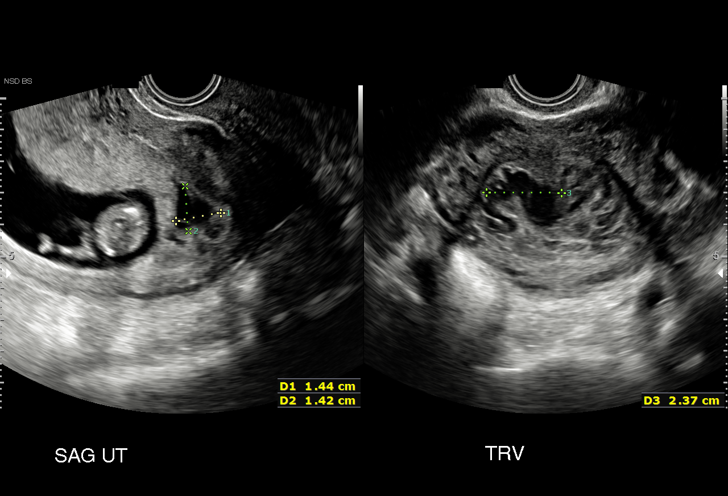
[im 7/27]
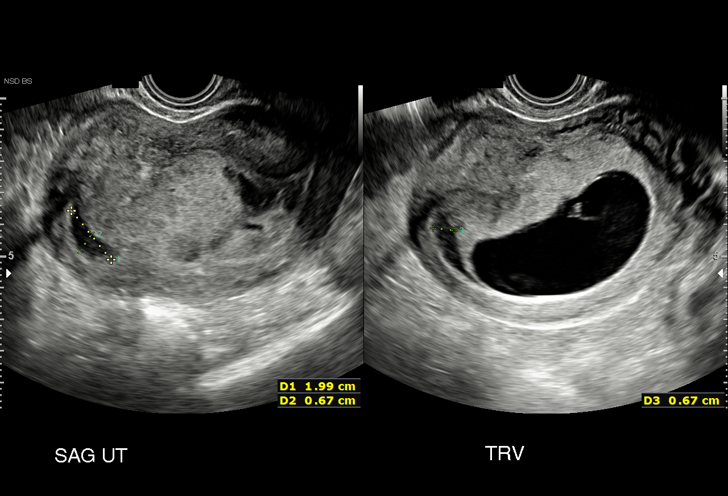
[im 9/27]
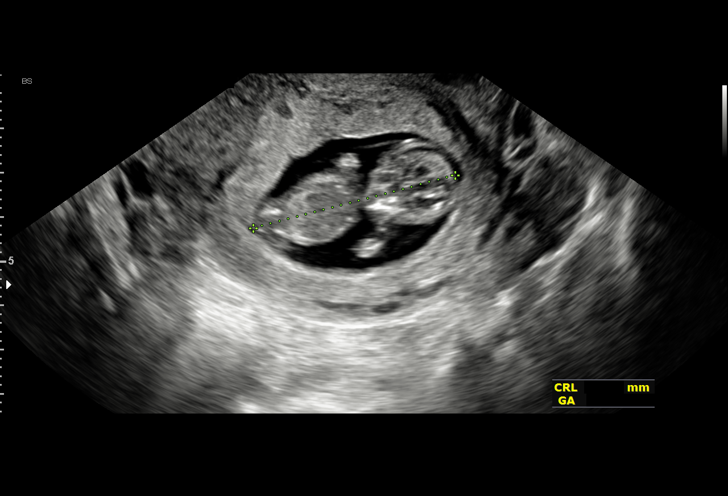
[im 10/27]
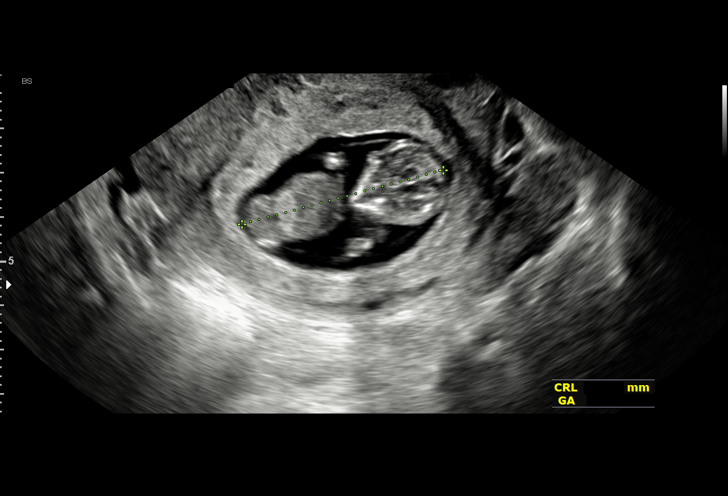
[im 12/27]
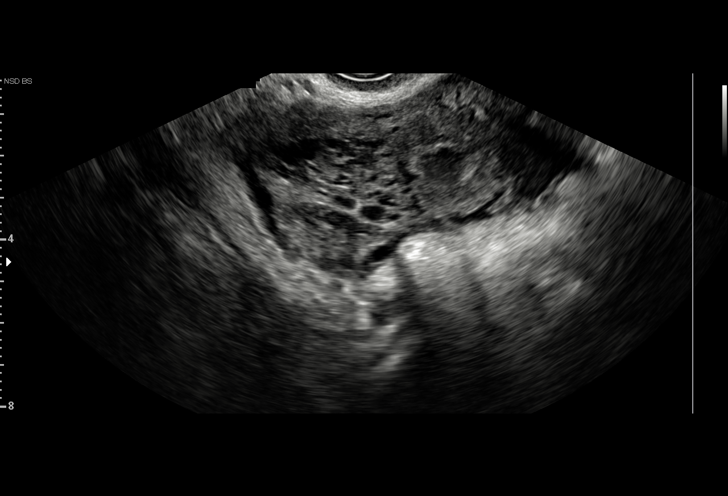
[im 14/27]
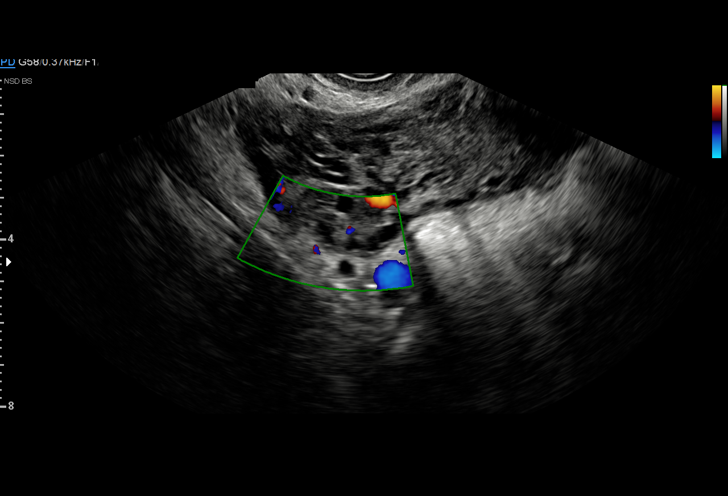
[im 16/27]
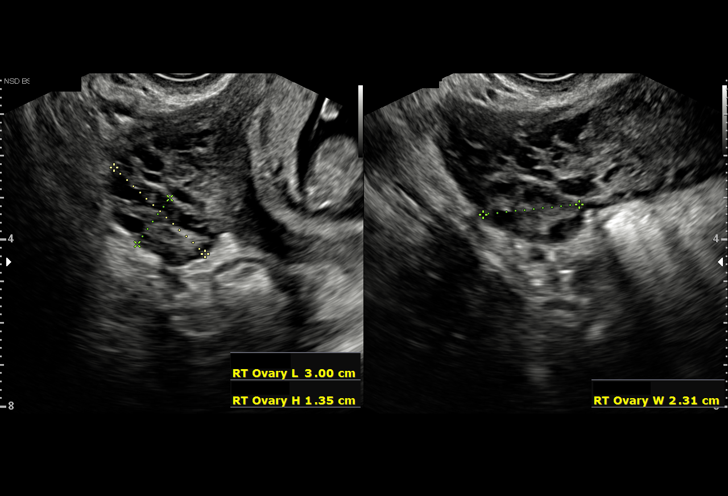
[im 18/27]
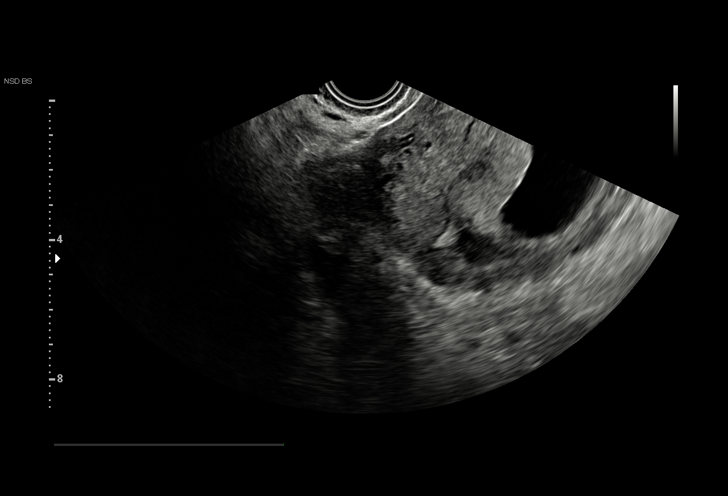
[im 19/27]
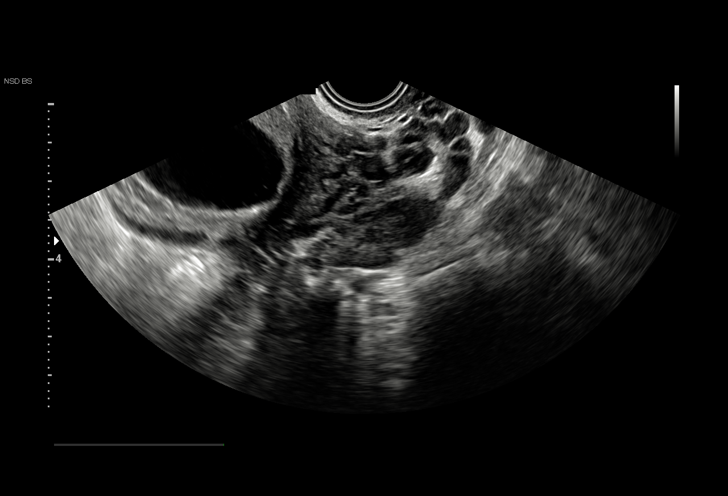
[im 21/27]
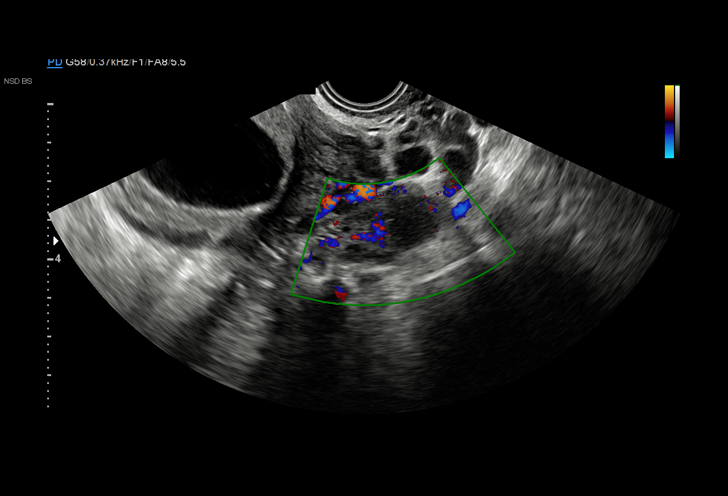
[im 23/27]
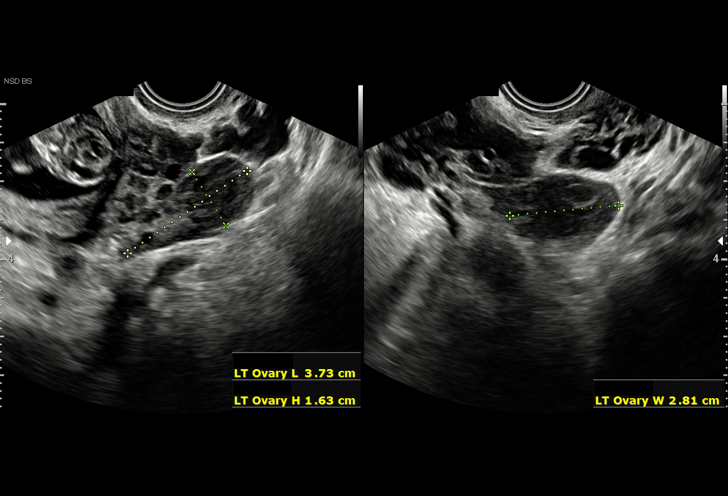
[im 25/27]
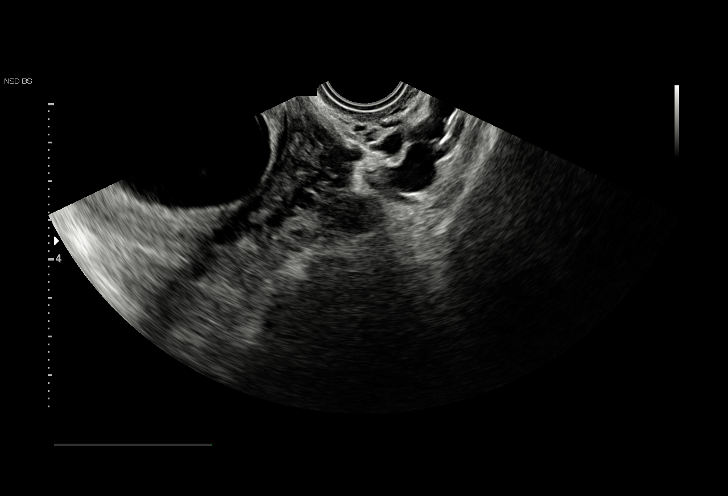
[im 27/27]
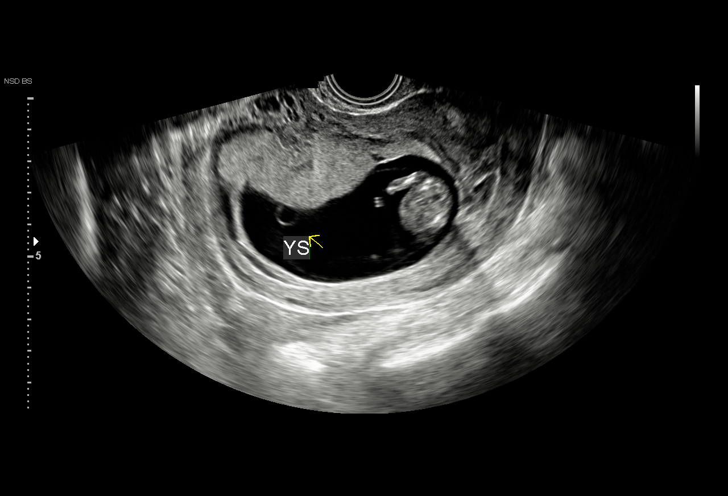

[15 of 27 positions shown; findings below may reference images not displayed]

FINDINGS: Intrauterine gestational sac: Single

Yolk sac:  Visualized.

Embryo:  Visualized.

Cardiac Activity: Visualized.

Heart Rate: 177 bpm

CRL:  47.2 mm   11 w   4 d                  US EDC: February 13, 2020

Subchorionic hemorrhage:  A small subchorionic hemorrhage is seen.

Maternal uterus/adnexae: The ovaries are visualized and are normal
in appearance.

No pelvic free fluid is seen.
IMPRESSION: A single, viable intrauterine pregnancy at approximately 11 weeks
and 4 days gestation by ultrasound evaluation.

## 2020-11-22 IMAGING — US US RENAL
1 series · 15 of 25 positions shown · non-contrast
Comparison: None.

CLINICAL DATA: Right lower quadrant pain.

EXAM:
RENAL / URINARY TRACT ULTRASOUND COMPLETE

[Series 1: us renal · 15 of 31 slices shown]
[im 1/31]
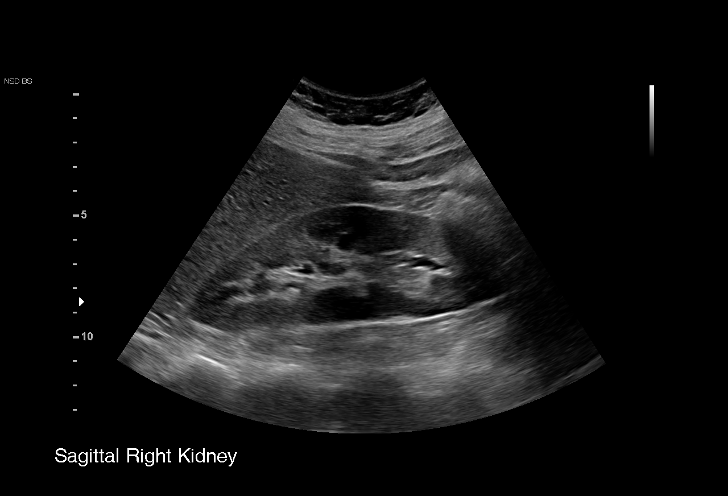
[im 3/31]
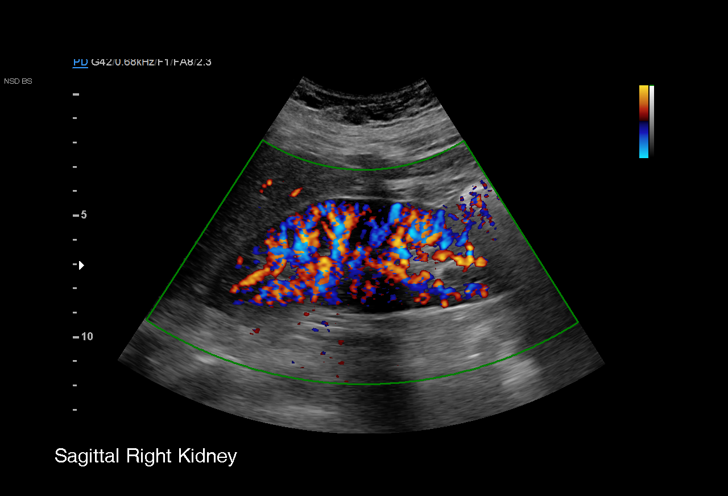
[im 6/31]
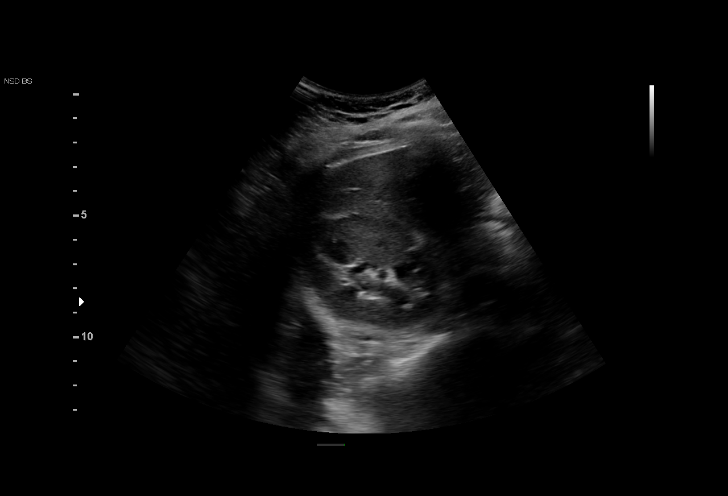
[im 7/31]
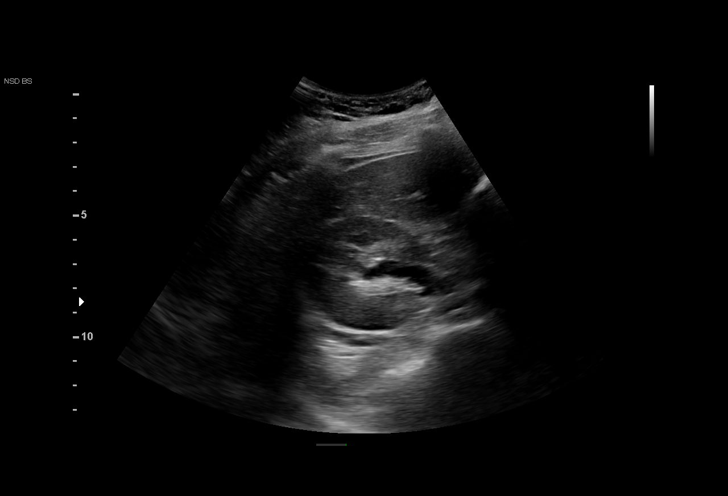
[im 9/31]
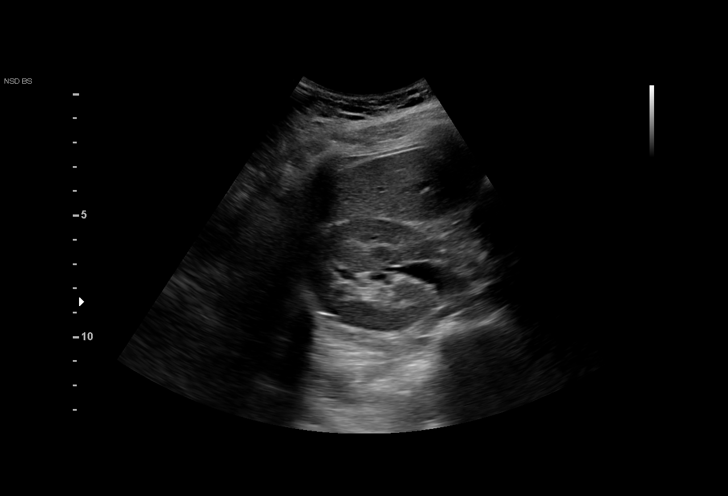
[im 12/31]
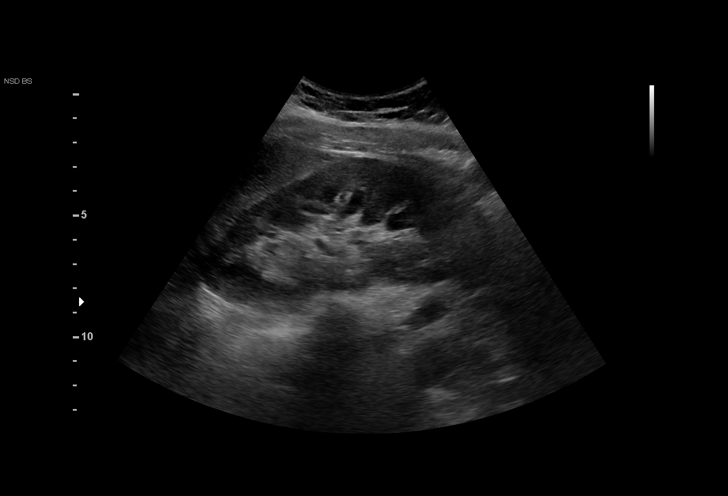
[im 13/31]
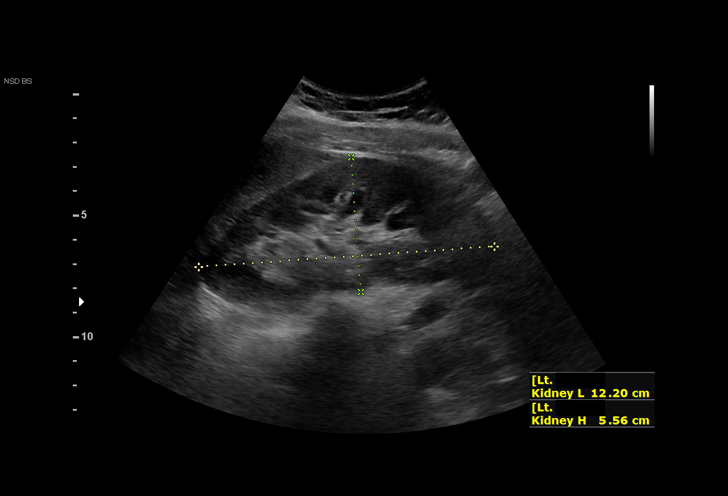
[im 16/31]
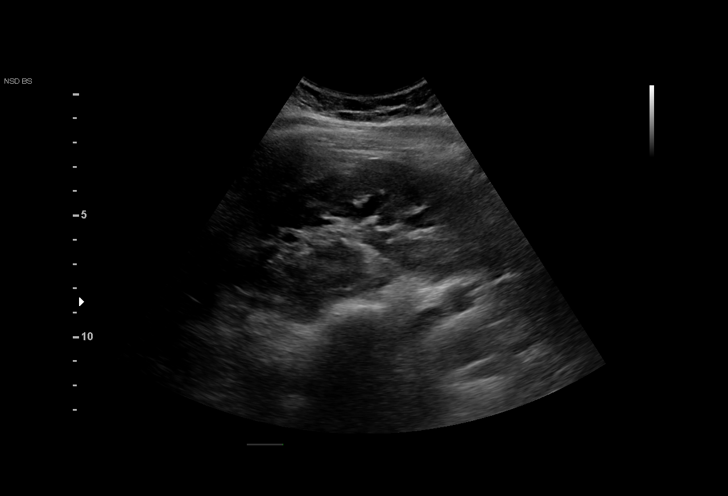
[im 18/31]
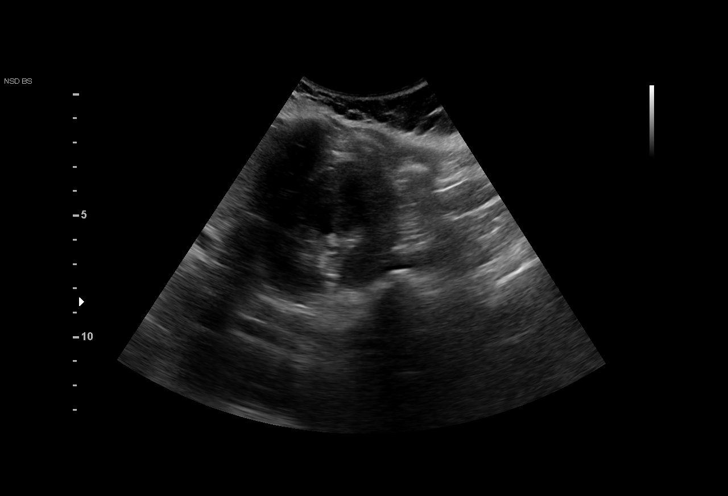
[im 19/31]
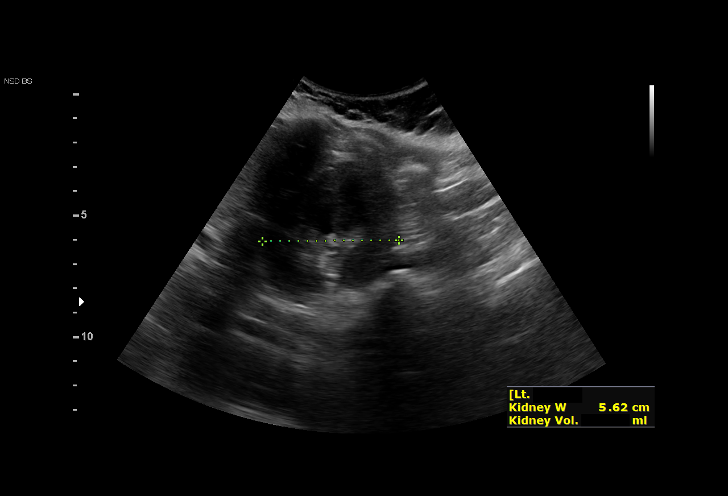
[im 22/31]
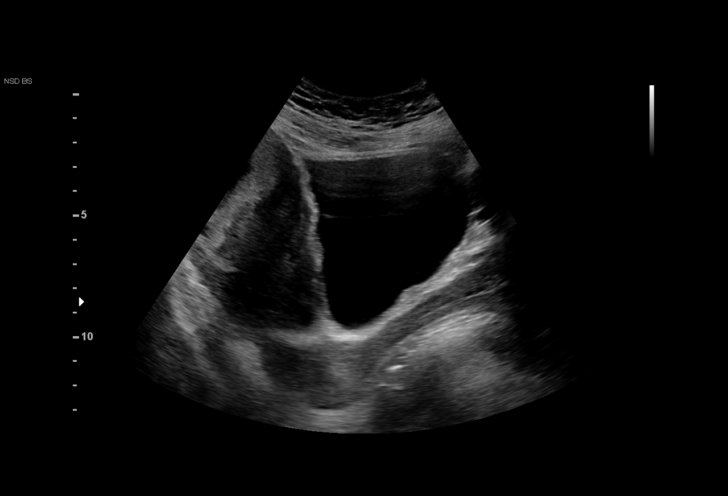
[im 24/31]
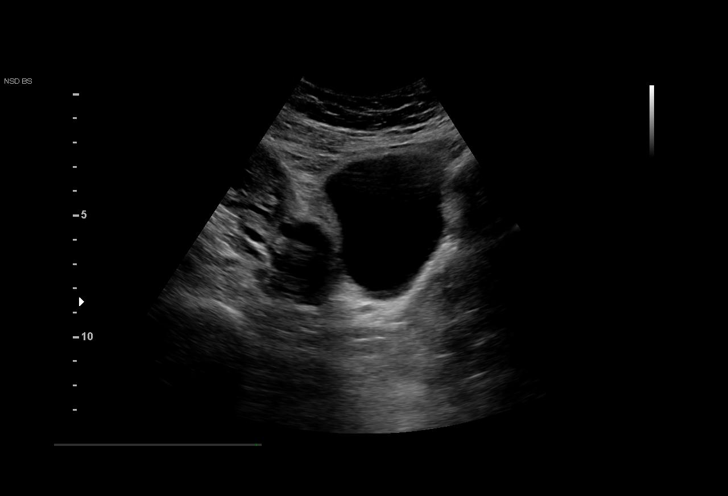
[im 26/31]
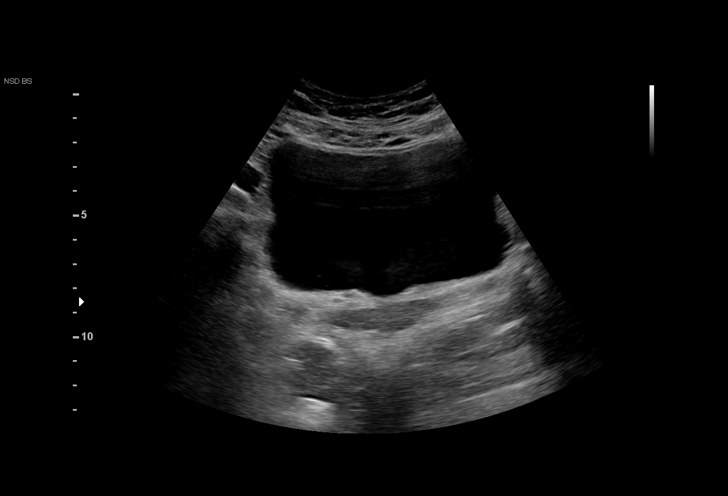
[im 28/31]
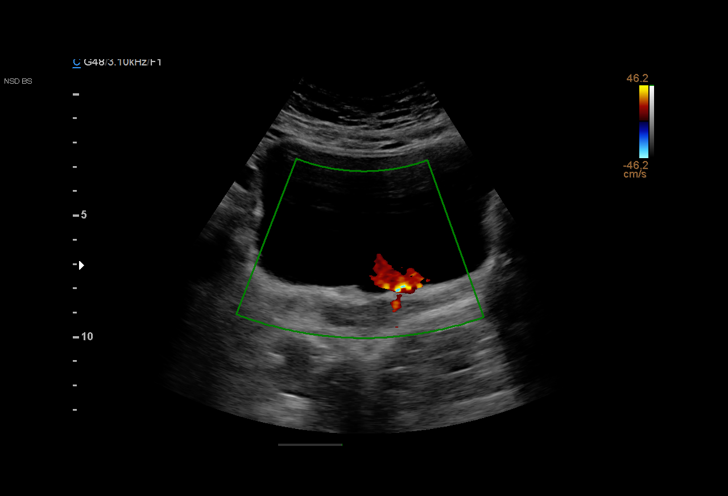
[im 31/31]
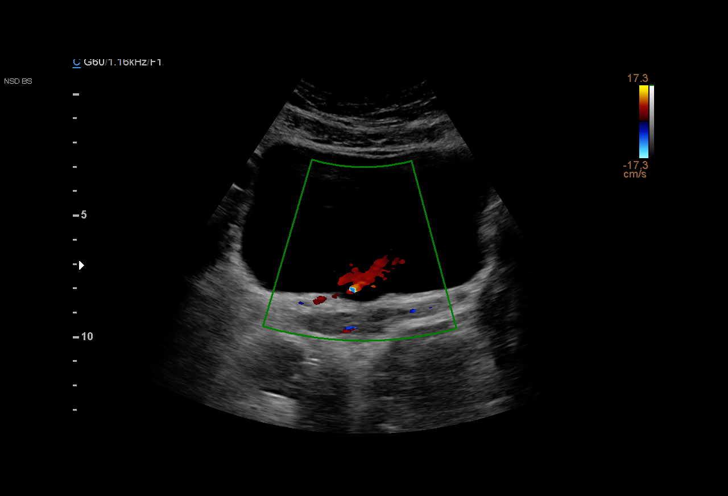

[15 of 25 positions shown; findings below may reference images not displayed]

FINDINGS: Right Kidney:

Renal measurements: 13.5 cm x 4.9 cm x 6.0 cm = volume: 207.1 mL .
Echogenicity within normal limits. No mass or hydronephrosis
visualized.

Left Kidney:

Renal measurements: 12.2 cm x 5.6 cm x 5.6 cm = volume: 199.3 mL.
Echogenicity within normal limits. No mass or hydronephrosis
visualized.

Bladder:

Appears normal for degree of bladder distention. Bilateral ureteral
jets are visualized.

Other:

None.
IMPRESSION: Normal renal ultrasound.

## 2021-05-14 ENCOUNTER — Emergency Department (HOSPITAL_COMMUNITY)
Admission: EM | Admit: 2021-05-14 | Discharge: 2021-05-15 | Disposition: A | Discharge status: Home / Self Care | Payer: BC Managed Care – PPO | Attending: Emergency Medicine | Admitting: Emergency Medicine

## 2021-05-14 ENCOUNTER — Encounter (HOSPITAL_COMMUNITY): Payer: Self-pay | Admitting: *Deleted

## 2021-05-14 ENCOUNTER — Emergency Department (HOSPITAL_COMMUNITY): Payer: BC Managed Care – PPO

## 2021-05-14 ENCOUNTER — Encounter (HOSPITAL_COMMUNITY): Payer: Self-pay | Admitting: Emergency Medicine

## 2021-05-14 ENCOUNTER — Ambulatory Visit (HOSPITAL_COMMUNITY)
Admission: EM | Admit: 2021-05-14 | Discharge: 2021-05-14 | Disposition: A | Discharge status: Other Acute Inpatient Hospital | Payer: Medicaid Other | Attending: Internal Medicine | Admitting: Internal Medicine

## 2021-05-14 ENCOUNTER — Other Ambulatory Visit: Payer: Self-pay

## 2021-05-14 DIAGNOSIS — R197 Diarrhea, unspecified: Secondary | ICD-10-CM | POA: Insufficient documentation

## 2021-05-14 DIAGNOSIS — R1011 Right upper quadrant pain: Secondary | ICD-10-CM | POA: Diagnosis present

## 2021-05-14 DIAGNOSIS — R630 Anorexia: Secondary | ICD-10-CM | POA: Diagnosis not present

## 2021-05-14 LAB — COMPREHENSIVE METABOLIC PANEL
ALT: 23 U/L (ref 0–44)
AST: 20 U/L (ref 15–41)
Albumin: 4.5 g/dL (ref 3.5–5.0)
Alkaline Phosphatase: 55 U/L (ref 38–126)
Anion gap: 6 (ref 5–15)
BUN: 15 mg/dL (ref 6–20)
CO2: 20 mmol/L — ABNORMAL LOW (ref 22–32)
Calcium: 8.8 mg/dL — ABNORMAL LOW (ref 8.9–10.3)
Chloride: 112 mmol/L — ABNORMAL HIGH (ref 98–111)
Creatinine, Ser: 0.54 mg/dL (ref 0.44–1.00)
GFR, Estimated: >60 mL/min (ref >60)
Glucose, Bld: 81 mg/dL (ref 70–99)
Potassium: 3.5 mmol/L (ref 3.5–5.1)
Sodium: 138 mmol/L (ref 135–145)
Total Bilirubin: 0.8 mg/dL (ref 0.3–1.2)
Total Protein: 7.9 g/dL (ref 6.5–8.1)

## 2021-05-14 LAB — I-STAT BETA HCG BLOOD, ED (MC, WL, AP ONLY): I-stat hCG, quantitative: <5 m[IU]/mL (ref <5)

## 2021-05-14 LAB — URINALYSIS, ROUTINE W REFLEX MICROSCOPIC
Bilirubin Urine: NEGATIVE
Glucose, UA: NEGATIVE mg/dL
Hgb urine dipstick: NEGATIVE
Ketones, ur: 20 mg/dL — AB
Nitrite: POSITIVE — AB
Protein, ur: NEGATIVE mg/dL
Specific Gravity, Urine: 1.012 (ref 1.005–1.030)
pH: 5 (ref 5.0–8.0)

## 2021-05-14 LAB — POCT URINALYSIS DIPSTICK, ED / UC
Bilirubin Urine: NEGATIVE
Glucose, UA: NEGATIVE mg/dL
Ketones, ur: NEGATIVE mg/dL
Leukocytes,Ua: NEGATIVE
Nitrite: NEGATIVE
Protein, ur: NEGATIVE mg/dL
Specific Gravity, Urine: 1.025 (ref 1.005–1.030)
Urobilinogen, UA: 0.2 mg/dL (ref 0.0–1.0)
pH: 5.5 (ref 5.0–8.0)

## 2021-05-14 LAB — POC URINE PREG, ED: Preg Test, Ur: NEGATIVE

## 2021-05-14 LAB — CBC
HCT: 41.7 % (ref 36.0–46.0)
Hemoglobin: 14.2 g/dL (ref 12.0–15.0)
MCH: 30.2 pg (ref 26.0–34.0)
MCHC: 34.1 g/dL (ref 30.0–36.0)
MCV: 88.7 fL (ref 80.0–100.0)
Platelets: 126 10*3/uL — ABNORMAL LOW (ref 150–400)
RBC: 4.7 MIL/uL (ref 3.87–5.11)
RDW: 12.4 % (ref 11.5–15.5)
WBC: 6.4 10*3/uL (ref 4.0–10.5)
nRBC: 0 % (ref 0.0–0.2)

## 2021-05-14 LAB — LIPASE, BLOOD: Lipase: 35 U/L (ref 11–51)

## 2021-05-14 MED ORDER — IOHEXOL 300 MG/ML  SOLN
100.0000 mL | Freq: Once | INTRAMUSCULAR | Status: AC | PRN
  Administered 2021-05-14: 100 mL via INTRAVENOUS

## 2021-05-14 NOTE — ED Provider Notes (Signed)
?[facility] DEPT ?Provider Note ? ? ?CSN: [id number] ?Arrival date & time: 05/14/21  1847 ? ?  ? ?History ? ?Chief Complaint  ?Patient presents with  ? Abdominal Pain  ? Diarrhea  ? ? ?Sally Haas is a 26 y.o. female. ? ?HPI ?Patient directed here to the ED for evaluation after she went to an urgent care earlier today, to be evaluated for right upper quadrant abdominal pain.  The pain in the abdomen radiates to the right mid back.  She has pain, which worsens, after eating.  She has had prior cholecystectomy and required an ERCP with sphincterotomy after that.  She reports that she has had diarrhea consisting of foul-smelling thin yellow stool, every time she eats, for 2 weeks.  She denies any blood in the emesis.  She has general decreased appetite but is not vomiting.  She denies fever or chills.  She is not having cough or chest pain.  She is in school and works Arts administrator.  She does not know of any abnormal food ingestions.  No known sick contacts.  She denies dysuria, urinary frequency or hematuria. ?  ? ?Home Medications ?Prior to Admission medications   ?Medication Sig Start Date End Date Taking? Authorizing Provider  ?amoxicillin (AMOXIL) 500 MG capsule Take 1 capsule (500 mg total) by mouth 3 (three) times daily. 07/22/19   Julianne Handler, CNM  ?Blood Pressure Monitoring (BLOOD PRESSURE KIT) DEVI 1 Device by Does not apply route as needed. 07/16/19   Tresea Mall, CNM  ?lamoTRIgine (LAMICTAL) 25 MG tablet Take 2 tablets (50 mg total) by mouth daily. 07/19/19   Merian Capron, MD  ?metoCLOPramide (REGLAN) 10 MG tablet Take 1 tablet (10 mg total) by mouth 3 (three) times daily with meals as needed for nausea. 07/06/19   Leftwich-Kirby, Kathie Dike, CNM  ?ondansetron (ZOFRAN ODT) 4 MG disintegrating tablet Take 1 tablet (4 mg total) by mouth every 6 (six) hours as needed for nausea. ?Patient not taking: Reported on 07/16/2019 07/01/19   Chauncey Mann, MD  ?pantoprazole  (PROTONIX) 20 MG tablet Take 1 tablet (20 mg total) by mouth daily. ?Patient not taking: Reported on 07/24/2019 07/19/19   Seabron Spates, CNM  ?promethazine (PHENERGAN) 25 MG tablet Take 0.5-1 tablets (12.5-25 mg total) by mouth every 6 (six) hours as needed for nausea. 07/06/19   Leftwich-Kirby, Kathie Dike, CNM  ?scopolamine (TRANSDERM-SCOP, 1.5 MG,) 1 MG/3DAYS Place 1 patch (1.5 mg total) onto the skin every 3 (three) days. ?Patient not taking: Reported on 07/24/2019 07/19/19   Seabron Spates, CNM  ?   ? ?Allergies    ?Patient has no known allergies.   ? ?Review of Systems   ?Review of Systems ? ?Physical Exam ?Updated Vital Signs ?BP 121/75 (BP Location: Left Arm)   Pulse 92   Temp 97.7 ?F (36.5 ?C) (Oral)   Resp 16   LMP 04/24/2021   SpO2 100%  ?Physical Exam ?Vitals and nursing note reviewed.  ?Constitutional:   ?   General: She is not in acute distress. ?   Appearance: She is well-developed. She is not ill-appearing or diaphoretic.  ?HENT:  ?   Head: Normocephalic and atraumatic.  ?   Right Ear: External ear normal.  ?   Left Ear: External ear normal.  ?   Mouth/Throat:  ?   Mouth: Mucous membranes are moist.  ?   Pharynx: No oropharyngeal exudate or posterior oropharyngeal erythema.  ?Eyes:  ?  Conjunctiva/sclera: Conjunctivae normal.  ?   Pupils: Pupils are equal, round, and reactive to light.  ?Neck:  ?   Trachea: Phonation normal.  ?Cardiovascular:  ?   Rate and Rhythm: Normal rate and regular rhythm.  ?   Heart sounds: Normal heart sounds.  ?Pulmonary:  ?   Effort: Pulmonary effort is normal. No respiratory distress.  ?   Breath sounds: Normal breath sounds. No stridor.  ?Abdominal:  ?   General: There is no distension.  ?   Palpations: Abdomen is soft.  ?   Tenderness: There is no abdominal tenderness.  ?Musculoskeletal:     ?   General: Normal range of motion.  ?   Cervical back: Normal range of motion and neck supple.  ?Skin: ?   General: Skin is warm and dry.  ?Neurological:  ?   Mental Status: She is  alert and oriented to person, place, and time.  ?   Cranial Nerves: No cranial nerve deficit.  ?   Sensory: No sensory deficit.  ?   Motor: No abnormal muscle tone.  ?   Coordination: Coordination normal.  ?Psychiatric:     ?   Mood and Affect: Mood normal.     ?   Behavior: Behavior normal.     ?   Thought Content: Thought content normal.     ?   Judgment: Judgment normal.  ? ? ?ED Results / Procedures / Treatments   ?Labs ?(all labs ordered are listed, but only abnormal results are displayed) ?Labs Reviewed  ?COMPREHENSIVE METABOLIC PANEL - Abnormal; Notable for the following components:  ?    Result Value  ? Chloride 112 (*)   ? CO2 20 (*)   ? Calcium 8.8 (*)   ? All other components within normal limits  ?CBC - Abnormal; Notable for the following components:  ? Platelets 126 (*)   ? All other components within normal limits  ?URINALYSIS, ROUTINE W REFLEX MICROSCOPIC - Abnormal; Notable for the following components:  ? APPearance HAZY (*)   ? Ketones, ur 20 (*)   ? Nitrite POSITIVE (*)   ? Leukocytes,Ua TRACE (*)   ? Bacteria, UA FEW (*)   ? All other components within normal limits  ?GASTROINTESTINAL PANEL BY PCR, STOOL (REPLACES STOOL CULTURE)  ?LIPASE, BLOOD  ?I-STAT BETA HCG BLOOD, ED ([facility], [facility], [facility] ONLY)  ? ? ?EKG ?None ? ?Radiology ?CT Abdomen Pelvis W Contrast ? ?Result Date: 05/14/2021 ?CLINICAL DATA:  Right upper abdominal pain, diarrhea EXAM: CT ABDOMEN AND PELVIS WITH CONTRAST TECHNIQUE: Multidetector CT imaging of the abdomen and pelvis was performed using the standard protocol following bolus administration of intravenous contrast. RADIATION DOSE REDUCTION: This exam was performed according to the departmental dose-optimization program which includes automated exposure control, adjustment of the mA and/or kV according to patient size and/or use of iterative reconstruction technique. CONTRAST:  143m OMNIPAQUE IOHEXOL 300 MG/ML  SOLN COMPARISON:  Outside hospital ([facility] CT abdomen/pelvis dated  04/16/2020 FINDINGS: Lower chest: Lung bases are clear. Hepatobiliary: Liver is within normal limits. Status post cholecystectomy. No intrahepatic or extrahepatic ductal dilatation. Pancreas: Within normal limits. Spleen: Within normal limits. Adrenals/Urinary Tract: Adrenal glands are within normal limits. Kidneys are within normal limits. No hydronephrosis. Bladder is within normal limits. Stomach/Bowel: Stomach is within normal limits. No evidence of bowel obstruction. Normal appendix (series 2/image 51). No colonic wall thickening or inflammatory changes. Vascular/Lymphatic: Aneurysm No suspicious abdominopelvic lymphadenopathy. Reproductive: Uterus is within normal limits. Bilateral ovaries are within  normal limits. Other: No abdominopelvic ascites. Musculoskeletal: Visualized osseous structures are within normal limits. IMPRESSION: Negative CT abdomen/pelvis. Prior cholecystectomy. Electronically Signed   By: Julian Hy M.D.   On: 05/14/2021 23:35   ? ?Procedures ?Procedures  ? ? ?Medications Ordered in ED ?Medications  ?iohexol (OMNIPAQUE) 300 MG/ML solution 100 mL (100 mLs Intravenous Contrast Given 05/14/21 2324)  ? ? ?ED Course/ Medical Decision Making/ A&P ?  ?                        ?Medical Decision Making ?She is presenting for evaluation of right upper quadrant abdominal pain radiating to her back, and diarrhea for 2 weeks.  She has had previous cholecystectomy and ERCP with sphincterotomy for removal of a retained stone.  This occurred 4 years ago.  No intervening problems with bowel disorders.  No known sick contacts. ? ?Problems Addressed: ?Diarrhea, unspecified type: acute illness or injury ?   Details: Yellow and foul-smelling ?Right upper quadrant abdominal pain: acute illness or injury ?   Details: Intermittent, with some food intolerance ? ?Amount and/or Complexity of Data Reviewed ?Independent Historian:  ?   Details: Cogent historian ?External Data Reviewed: notes. ?   Details: Urgent  care notes from clinic visit today, referred here for further evaluation and possible imaging. ?Labs: ordered. ?   Details: CBC, metabolic panel, lipase, urinalysis, pregnancy test-normal except chloride high, CO2 low, ca

## 2021-05-14 NOTE — ED Provider Notes (Signed)
?[facility] ? ? ? ?CSN: [id number] ?Arrival date & time: 05/14/21  1720 ? ? ?  ? ?History   ?Chief Complaint ?Chief Complaint  ?Patient presents with  ? Abdominal Pain  ?  Entered by patient  ? ? ?HPI ?Sally Haas is a 26 y.o. female. Pt reports RUQ pain for 5 days. Was intermittent until today, now is constant. Associated with nausea but no vomiting. Appetite decreased, feels like eating makes pain worse.  Pain is in the right upper quadrant and also radiates through to her back just below her right shoulder blade.  Reports frequent episodes of yellow watery diarrhea for a long period of time and is not associated or changed with current abdominal pain symptoms.  Has not talked to her PCP about this.  Had cholecystectomy in 2019 and then ended up with a ERCP with sphincterotomy and balloon extraction due to choledocholithiasis a week later. ? ? ? ? ?Abdominal Pain ? ?Past Medical History:  ?Diagnosis Date  ? Bipolar disease, chronic ([place])   ? Ectopic pregnancy   ? Kidney stone   ? Thrombocytopenia ([place])   ? ? ?Patient Active Problem List  ? Diagnosis Date Noted  ? 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 Supervision of low-risk pregnancy 07/16/2019  ? Depression affecting pregnancy 07/16/2019  ? Rh negative state in antepartum period 05/07/2019  ? Abdominal pain in pregnancy 05/06/2019  ? Pregnancy of unknown anatomic location 05/06/2019  ? Thrombocytopenia ([place]) 05/06/2019  ? ? ?Past Surgical History:  ?Procedure Laterality Date  ? DIAGNOSTIC LAPAROSCOPY WITH REMOVAL OF ECTOPIC PREGNANCY Right 05/07/2019  ? Procedure: Laparoscopic Removal Of Ectopic Pregnancy;  Surgeon: Donnamae Jude, MD;  Location: [facility];  Service: Gynecology;  Laterality: Right;  ? LAPAROSCOPIC CHOLECYSTECTOMY    ? ? ?OB History   ? ? Gravida  ?4  ? Para  ?1  ? Term  ?1  ? Preterm  ?   ? AB  ?2  ? Living  ?1  ?  ? ? SAB  ?1  ? IAB  ?   ? Ectopic  ?1  ? Multiple  ?   ? Live Births  ?1  ?   ?  ? Obstetric Comments   ?G1: early SAB ?G2: TSVD 2019 ?  ?  ? ?  ? ? ? ?Home Medications   ? ?Prior to Admission medications   ?Medication Sig Start Date End Date Taking? Authorizing Provider  ?amoxicillin (AMOXIL) 500 MG capsule Take 1 capsule (500 mg total) by mouth 3 (three) times daily. 07/22/19   Julianne Handler, CNM  ?Blood Pressure Monitoring (BLOOD PRESSURE KIT) DEVI 1 Device by Does not apply route as needed. 07/16/19   Tresea Mall, CNM  ?lamoTRIgine (LAMICTAL) 25 MG tablet Take 2 tablets (50 mg total) by mouth daily. 07/19/19   Merian Capron, MD  ?metoCLOPramide (REGLAN) 10 MG tablet Take 1 tablet (10 mg total) by mouth 3 (three) times daily with meals as needed for nausea. 07/06/19   Leftwich-Kirby, Kathie Dike, CNM  ?ondansetron (ZOFRAN ODT) 4 MG disintegrating tablet Take 1 tablet (4 mg total) by mouth every 6 (six) hours as needed for nausea. ?Patient not taking: Reported on 07/16/2019 07/01/19   Chauncey Mann, MD  ?pantoprazole (PROTONIX) 20 MG tablet Take 1 tablet (20 mg total) by mouth daily. ?Patient not taking: Reported on 07/24/2019 07/19/19   Seabron Spates, CNM  ?promethazine (PHENERGAN) 25 MG tablet Take 0.5-1 tablets (12.5-25 mg  total) by mouth every 6 (six) hours as needed for nausea. 07/06/19   Leftwich-Kirby, Kathie Dike, CNM  ?scopolamine (TRANSDERM-SCOP, 1.5 MG,) 1 MG/3DAYS Place 1 patch (1.5 mg total) onto the skin every 3 (three) days. ?Patient not taking: Reported on 07/24/2019 07/19/19   Seabron Spates, CNM  ? ? ?Family History ?Family History  ?Problem Relation Age of Onset  ? Alcohol abuse Father   ? ? ?Social History ?Social History  ? ?Tobacco Use  ? Smoking status: Never  ? Smokeless tobacco: Never  ?Vaping Use  ? Vaping Use: Never used  ?Substance Use Topics  ? Alcohol use: Never  ? Drug use: Never  ? ? ? ?Allergies   ?Patient has no known allergies. ? ? ?Review of Systems ?Review of Systems  ?Gastrointestinal:  Positive for abdominal pain.  ? ? ?Physical Exam ?Triage Vital Signs ?ED Triage Vitals  ?Enc Vitals  Group  ?   BP 05/14/21 1755 121/79  ?   Pulse Rate 05/14/21 1755 73  ?   Resp 05/14/21 1755 18  ?   Temp 05/14/21 1755 98.3 ?F (36.8 ?C)  ?   Temp src --   ?   SpO2 05/14/21 1755 98 %  ?   Weight --   ?   Height --   ?   Head Circumference --   ?   Peak Flow --   ?   Pain Score 05/14/21 1751 8  ?   Pain Loc --   ?   Pain Edu? --   ?   Excl. in [place]? --   ? ?No data found. ? ?Updated Vital Signs ?BP 121/79   Pulse 73   Temp 98.3 ?F (36.8 ?C)   Resp 18   LMP 04/24/2021   SpO2 98%  ? ?Visual Acuity ?Right Eye Distance:   ?Left Eye Distance:   ?Bilateral Distance:   ? ?Right Eye Near:   ?Left Eye Near:    ?Bilateral Near:    ? ?Physical Exam ?Constitutional:   ?   General: She is not in acute distress. ?   Appearance: She is well-developed. She is not ill-appearing.  ?Cardiovascular:  ?   Rate and Rhythm: Normal rate and regular rhythm.  ?Pulmonary:  ?   Effort: Pulmonary effort is normal.  ?   Breath sounds: Normal breath sounds.  ?Abdominal:  ?   General: Abdomen is flat. Bowel sounds are decreased.  ?   Palpations: Abdomen is soft.  ?   Tenderness: There is abdominal tenderness in the right upper quadrant and periumbilical area. There is right CVA tenderness and guarding. There is no left CVA tenderness or rebound.  ?Neurological:  ?   Mental Status: She is alert.  ? ? ? ?UC Treatments / Results  ?Labs ?(all labs ordered are listed, but only abnormal results are displayed) ?Labs Reviewed  ?POCT URINALYSIS DIPSTICK, ED / UC - Abnormal; Notable for the following components:  ?    Result Value  ? Hgb urine dipstick TRACE (*)   ? All other components within normal limits  ?POC URINE PREG, ED  ? ? ?EKG ? ? ?Radiology ?No results found. ? ?Procedures ?Procedures (including critical care time) ? ?Medications Ordered in UC ?Medications - No data to display ? ?Initial Impression / Assessment and Plan / UC Course  ?I have reviewed the triage vital signs and the nursing notes. ? ?Pertinent labs & imaging results that were  available during my care of the patient  were reviewed by me and considered in my medical decision making (see chart for details). ? ?  ?Patient with right upper quadrant and periumbilical abdominal pain, tenderness to palpation on exam.  UA unremarkable, urine pregnancy negative.  Advised patient to seek further care in the ER. ? ?Final Clinical Impressions(s) / UC Diagnoses  ? ?Final diagnoses:  ?Right upper quadrant abdominal pain  ? ? ? ?Discharge Instructions   ? ?  ?Please go to the ER for further care.  ? ? ?ED Prescriptions   ?None ?  ? ?PDMP not reviewed this encounter. ?  ?Carvel Getting, NP ?05/14/21 1854 ? ?

## 2021-05-14 NOTE — Discharge Instructions (Signed)
There were no serious problems found on the testing today.  To treat diarrhea you can try taking Imodium or Kaopectate.  If you continue to have diarrhea and want to get further evaluation, take a stool sample to your PCP for testing.  Try to drink plenty of fluids and eat a low fiber diet as given on the attached instructions.  Return here, if needed. ?

## 2021-05-14 NOTE — ED Triage Notes (Signed)
Pt reports RUQ pain that radiates rt shoulder blade. Pt reports nausea and diarrhea. Pt had her gallbladder removed 4 years ago. ?

## 2021-05-14 NOTE — ED Triage Notes (Signed)
Pt reports upper right abd pain x 5 days. Pt also reports diarrhea after eating.  ?

## 2021-05-14 NOTE — Discharge Instructions (Addendum)
Please go to the ER for further care.  °

## 2021-05-20 ENCOUNTER — Telehealth: Payer: BC Managed Care – PPO | Admitting: Physician Assistant

## 2021-05-20 DIAGNOSIS — R3989 Other symptoms and signs involving the genitourinary system: Secondary | ICD-10-CM | POA: Diagnosis not present

## 2021-05-20 MED ORDER — SULFAMETHOXAZOLE-TRIMETHOPRIM 800-160 MG PO TABS: 1.0000 | ORAL_TABLET | Freq: Two times a day (BID) | ORAL | 0 refills | Status: DC

## 2021-05-20 NOTE — Progress Notes (Signed)

## 2021-07-08 ENCOUNTER — Encounter: Payer: Self-pay | Admitting: Family Medicine

## 2021-07-08 ENCOUNTER — Ambulatory Visit (INDEPENDENT_AMBULATORY_CARE_PROVIDER_SITE_OTHER): Payer: BC Managed Care – PPO | Admitting: Family Medicine

## 2021-07-08 VITALS — BP 110/70 | HR 94 | Temp 98.5°F | Ht 65.0 in | Wt 206.2 lb

## 2021-07-08 DIAGNOSIS — D696 Thrombocytopenia, unspecified: Secondary | ICD-10-CM

## 2021-07-08 DIAGNOSIS — R5383 Other fatigue: Secondary | ICD-10-CM

## 2021-07-08 DIAGNOSIS — Z23 Encounter for immunization: Secondary | ICD-10-CM | POA: Diagnosis not present

## 2021-07-08 DIAGNOSIS — R000 Tachycardia, unspecified: Secondary | ICD-10-CM | POA: Diagnosis not present

## 2021-07-08 LAB — TSH: TSH: 0.68 u[IU]/mL (ref 0.35–5.50)

## 2021-07-08 LAB — CBC WITH DIFFERENTIAL/PLATELET
Basophils Absolute: 0 10*3/uL (ref 0.0–0.1)
Basophils Relative: 0.7 % (ref 0.0–3.0)
Eosinophils Absolute: 0 10*3/uL (ref 0.0–0.7)
Eosinophils Relative: 0.9 % (ref 0.0–5.0)
HCT: 42.7 % (ref 36.0–46.0)
Hemoglobin: 14.2 g/dL (ref 12.0–15.0)
Lymphocytes Relative: 32.4 % (ref 12.0–46.0)
Lymphs Abs: 1.5 10*3/uL (ref 0.7–4.0)
MCHC: 33.3 g/dL (ref 30.0–36.0)
MCV: 87.4 fl (ref 78.0–100.0)
Monocytes Absolute: 0.3 10*3/uL (ref 0.1–1.0)
Monocytes Relative: 5.6 % (ref 3.0–12.0)
Neutro Abs: 2.9 10*3/uL (ref 1.4–7.7)
Neutrophils Relative %: 60.4 % (ref 43.0–77.0)
Platelets: 133 10*3/uL — ABNORMAL LOW (ref 150.0–400.0)
RBC: 4.89 Mil/uL (ref 3.87–5.11)
RDW: 13.8 % (ref 11.5–15.5)
WBC: 4.8 10*3/uL (ref 4.0–10.5)

## 2021-07-08 LAB — COMPREHENSIVE METABOLIC PANEL
ALT: 18 U/L (ref 0–35)
AST: 21 U/L (ref 0–37)
Albumin: 4.6 g/dL (ref 3.5–5.2)
Alkaline Phosphatase: 64 U/L (ref 39–117)
BUN: 15 mg/dL (ref 6–23)
CO2: 25 mEq/L (ref 19–32)
Calcium: 9.4 mg/dL (ref 8.4–10.5)
Chloride: 102 mEq/L (ref 96–112)
Creatinine, Ser: 0.66 mg/dL (ref 0.40–1.20)
GFR: 123.1 mL/min (ref >60.00)
Glucose, Bld: 90 mg/dL (ref 70–99)
Potassium: 4.2 mEq/L (ref 3.5–5.1)
Sodium: 137 mEq/L (ref 135–145)
Total Bilirubin: 0.5 mg/dL (ref 0.2–1.2)
Total Protein: 7.8 g/dL (ref 6.0–8.3)

## 2021-07-08 LAB — PROTIME-INR
INR: 1 ratio (ref 0.8–1.0)
Prothrombin Time: 10.7 s (ref 9.6–13.1)

## 2021-07-08 LAB — APTT: aPTT: 32.7 s (ref 25.4–36.8)

## 2021-07-08 NOTE — Patient Instructions (Signed)
Welcome to [facility]! It was a pleasure meeting you today.  As discussed, Please schedule a 12 month follow up visit today.  Afrin nasal spray-use when nose bleeds  PLEASE NOTE:  If you had any LAB tests please let us know if you have not heard back within a few days. You may see your results on MyChart before we have a chance to review them but we will give you a call once they are reviewed by [place]. If we ordered any REFERRALS today, please let us know if you have not heard from their office within the next week.  Let us know through MyChart if you are needing REFILLS, or have your pharmacy send [place] the request. You can also use MyChart to communicate with me or any office staff.  Please try these tips to maintain a healthy lifestyle:  Eat most of your calories during the day when you are active. Eliminate processed foods including packaged sweets (pies, cakes, cookies), reduce intake of potatoes, white bread, white pasta, and white rice. Look for whole grain options, oat flour or almond flour.  Each meal should contain half fruits/vegetables, one quarter protein, and one quarter carbs (no bigger than a computer mouse).  Cut down on sweet beverages. This includes juice, soda, and sweet tea. Also watch fruit intake, though this is a healthier sweet option, it still contains natural sugar! Limit to 3 servings daily.  Drink at least 1 glass of water with each meal and aim for at least 8 glasses per day  Exercise at least 150 minutes every week.

## 2021-07-08 NOTE — Progress Notes (Signed)
New Patient Office Visit  Subjective:  Patient ID: Sally Haas, female    DOB: [birth year]  Age: 26 y.o. MRN: [id number]  CC:  Chief Complaint  Patient presents with   Establish Care    Need new pcp Has been told she had low platelets, referral to specialist, keep getting bruises on legs Fasting     HPI-Moved to area 1 mo ago Sally Haas presents for new pt low plts  Has been told plts low for sev yrs-couldn't do epidural-2019.  Second daughter-couldn't get it again d/t low plts..  Needs referral.  Keeps getting bruises on legs-no specific injury and can be huge.  No excessive bleeding after surgeries.  Menses area heavy for 2 days.  Does get nosebleeds monthly and can bleed 66mnutes.  No FH 2.  Moods-orig thought poss bipolar-seeing psych and told not.  Poss ADHD.  No meds and "doing ok".  Some days of "really high energy".  Not up all night.  At times, lower energy.   3.  Tired a lot-school, work, 2 small children.  Not depressed.  Body can feel "heavy" at times. HR has been elevated-more when active  Past Medical History:  Diagnosis Date   Bipolar disease, chronic ([facility])    Ectopic pregnancy    Kidney stone    Thrombocytopenia ([facility]     Past Surgical History:  Procedure Laterality Date   DIAGNOSTIC LAPAROSCOPY WITH REMOVAL OF ECTOPIC PREGNANCY Right 05/07/2019   Procedure: Laparoscopic Removal Of Ectopic Pregnancy;  Surgeon: PDonnamae Jude MD;  Location[place] [facility]  Service: Gynecology;  Laterality: Right;   LAPAROSCOPIC CHOLECYSTECTOMY     TUBAL LIGATION  03/2020    Family History  Problem Relation Age of Onset   Cancer Mother    Alcohol abuse Father    Diabetes Paternal Aunt    Cancer Maternal Grandmother    Cancer Paternal Grandmother    Cancer Paternal Grandfather     Social History   Socioeconomic History   Marital status: Married    Spouse name: Not on file   Number of children: 2   Years of education: Not on file   Highest education level: Not on  file  Occupational History   Not on file  Tobacco Use   Smoking status: Never   Smokeless tobacco: Never  Vaping Use   Vaping Use: Never used  Substance and Sexual Activity   Alcohol use: Never   Drug use: Never   Sexual activity: Yes    Birth control/protection: Surgical, None  Other Topics Concern   Not on file  Social History Narrative   Student and part time work-business.  Working [employer]     Social Determinants of Health   Financial Resource Strain: Not on file  Food Insecurity: No Food Insecurity (07/24/2019)   Hunger Vital Sign    Worried About Running Out of Food in the Last Year: Never true    Ran Out of Food in the Last Year: Never true  Transportation Needs: No Transportation Needs (07/24/2019)   PRAPARE - THydrologist(Medical): No    Lack of Transportation (Non-Medical): No  Physical Activity: Not on file  Stress: Not on file  Social Connections: Not on file  Intimate Partner Violence: Not At Risk (07/16/2019)   Humiliation, Afraid, Rape, and Kick questionnaire    Fear of Current or Ex-Partner: No    Emotionally Abused: No    Physically Abused: No  Sexually Abused: No    ROS  ROS: Gen: no fever, chills  Skin: no rash, itching ENT: no ear pain, ear drainage, nasal congestion, rhinorrhea, sinus pressure, sore throat Eyes: no blurry vision, double vision Resp: no cough, wheeze,SOB CV: no CP, palpitations, LE edema,  has seen Card in past as HR elevated this last pregnancy-work monitor.  Seemed to resolve until went to gym and HR 130 and wouldn't decrease.didn't f/u Card as moved here. GI: no heartburn, n/v/c, abd pain.  Will get diarrhea freq-cramps and will go. For yrs. GU: no dysuria, urgency, frequency, hematuria MSK: some days achey.  Neuro: no dizziness, headache, weakness, vertigo Psych: no depression, anxiety, insomnia, SI   Objective:   Today's Vitals: BP 110/70   Pulse 94   Temp 98.5 F (36.9 C) (Temporal)   Ht  5' 5"  (1.651 m)   Wt 206 lb 4 oz (93.6 kg)   SpO2 98%   BMI 34.32 kg/m   Physical Exam  Gen: WDWN NAD owf HEENT: NCAT, conjunctiva not injected, sclera nonicteric TM WNL B, OP moist, no exudates  NECK:  supple, no thyromegaly, no nodes, no carotid bruits CARDIAC: tachyRRR, S1S2+, no murmur. DP 2+B LUNGS: CTAB. No wheezes ABDOMEN:  BS+, soft, NTND, No HSM, no masses EXT:  no edema MSK: no gross abnormalities.  NEURO: A&O x3.  CN II-XII intact.  PSYCH: normal mood. Good eye contact   Assessment & Plan:   Problem List Items Addressed This Visit       Hematopoietic and Hemostatic   Thrombocytopenia ([place]) - Primary   Relevant Orders   CBC with Differential/Platelet   APTT   Protime-INR   Ambulatory referral to Hematology / Oncology   Other Visit Diagnoses     Need for HPV vaccination       Relevant Orders   HPV 9-valent vaccine,Recombinat (Completed)   Other fatigue       Relevant Orders   Comprehensive metabolic panel   TSH   CBC with Differential/Platelet   Tachycardia       Relevant Orders   Ambulatory referral to Cardiology      Thrombocytopenia-chronic, but some symptoms.  Check cbc,pt, ptt.  Refer heme Tachcardia-for 2+ yrs.  Saw Card in past.  Will refer.  Check TSH Fatigue-check cmp,tsh,cbc.  Work on [place] Encounter Medications as of 07/08/2021  Medication Sig   acetaminophen (TYLENOL) 500 MG tablet Take by mouth. (Patient not taking: Reported on 07/08/2021)   Blood Pressure Monitoring (BLOOD PRESSURE KIT) DEVI 1 Device by Does not apply route as needed. (Patient not taking: Reported on 07/08/2021)   [DISCONTINUED] lamoTRIgine (LAMICTAL) 25 MG tablet Take 2 tablets (50 mg total) by mouth daily.   [DISCONTINUED] metoCLOPramide (REGLAN) 10 MG tablet Take 1 tablet (10 mg total) by mouth 3 (three) times daily with meals as needed for nausea.   [DISCONTINUED] sulfamethoxazole-trimethoprim (BACTRIM DS) 800-160 MG tablet Take 1 tablet by mouth 2 (two) times  daily.   No facility-administered encounter medications on file as of 07/08/2021.    Follow-up: Return in about 1 year (around 07/09/2022) for annual for me.   HPV #2 vaccine w/nurse 2 months.   Wellington Hampshire, MD

## 2021-07-14 ENCOUNTER — Other Ambulatory Visit: Payer: Self-pay | Admitting: *Deleted

## 2021-07-14 DIAGNOSIS — D696 Thrombocytopenia, unspecified: Secondary | ICD-10-CM

## 2021-07-21 ENCOUNTER — Emergency Department (HOSPITAL_BASED_OUTPATIENT_CLINIC_OR_DEPARTMENT_OTHER)
Admission: EM | Admit: 2021-07-21 | Discharge: 2021-07-22 | Disposition: A | Discharge status: Home / Self Care | Payer: BC Managed Care – PPO | Attending: Emergency Medicine | Admitting: Emergency Medicine

## 2021-07-21 ENCOUNTER — Encounter (HOSPITAL_BASED_OUTPATIENT_CLINIC_OR_DEPARTMENT_OTHER): Payer: Self-pay | Admitting: Emergency Medicine

## 2021-07-21 ENCOUNTER — Other Ambulatory Visit: Payer: Self-pay

## 2021-07-21 DIAGNOSIS — R040 Epistaxis: Secondary | ICD-10-CM | POA: Insufficient documentation

## 2021-07-21 DIAGNOSIS — R1011 Right upper quadrant pain: Secondary | ICD-10-CM | POA: Insufficient documentation

## 2021-07-21 DIAGNOSIS — R1013 Epigastric pain: Secondary | ICD-10-CM | POA: Diagnosis not present

## 2021-07-21 LAB — CBC
HCT: 40.7 % (ref 36.0–46.0)
Hemoglobin: 13.7 g/dL (ref 12.0–15.0)
MCH: 29.1 pg (ref 26.0–34.0)
MCHC: 33.7 g/dL (ref 30.0–36.0)
MCV: 86.4 fL (ref 80.0–100.0)
Platelets: 108 10*3/uL — ABNORMAL LOW (ref 150–400)
RBC: 4.71 MIL/uL (ref 3.87–5.11)
RDW: 12.8 % (ref 11.5–15.5)
WBC: 4.4 10*3/uL (ref 4.0–10.5)
nRBC: 0 % (ref 0.0–0.2)

## 2021-07-21 LAB — URINALYSIS, ROUTINE W REFLEX MICROSCOPIC
Bilirubin Urine: NEGATIVE
Glucose, UA: NEGATIVE mg/dL
Ketones, ur: NEGATIVE mg/dL
Leukocytes,Ua: NEGATIVE
Nitrite: NEGATIVE
Specific Gravity, Urine: 1.032 — ABNORMAL HIGH (ref 1.005–1.030)
pH: 6 (ref 5.0–8.0)

## 2021-07-21 LAB — COMPREHENSIVE METABOLIC PANEL
ALT: 18 U/L (ref 0–44)
AST: 18 U/L (ref 15–41)
Albumin: 4.5 g/dL (ref 3.5–5.0)
Alkaline Phosphatase: 67 U/L (ref 38–126)
Anion gap: 9 (ref 5–15)
BUN: 13 mg/dL (ref 6–20)
CO2: 24 mmol/L (ref 22–32)
Calcium: 9.1 mg/dL (ref 8.9–10.3)
Chloride: 104 mmol/L (ref 98–111)
Creatinine, Ser: 0.65 mg/dL (ref 0.44–1.00)
GFR, Estimated: >60 mL/min (ref >60)
Glucose, Bld: 87 mg/dL (ref 70–99)
Potassium: 3.6 mmol/L (ref 3.5–5.1)
Sodium: 137 mmol/L (ref 135–145)
Total Bilirubin: 0.8 mg/dL (ref 0.3–1.2)
Total Protein: 7.6 g/dL (ref 6.5–8.1)

## 2021-07-21 LAB — LIPASE, BLOOD: Lipase: 17 U/L (ref 11–51)

## 2021-07-21 LAB — PREGNANCY, URINE: Preg Test, Ur: NEGATIVE

## 2021-07-21 MED ORDER — OXYMETAZOLINE HCL 0.05 % NA SOLN
2.0000 | Freq: Once | NASAL | Status: AC
  Administered 2021-07-21: 2 via NASAL
  Filled 2021-07-21: qty 30

## 2021-07-21 NOTE — ED Triage Notes (Signed)
Pt arrives pov, steady gait, c/o right nares nose bleed x 2 hrs pta. Bleeding controlled in triage. Denies injury, endorses mild, dull HA. Also c/o RLQ  pain with nausea starting at 1600 today

## 2021-07-21 NOTE — ED Provider Notes (Signed)
[facility] EMERGENCY DEPT Provider Note   CSN: [id number] Arrival date & time: 07/21/21  2219     History  Chief Complaint  Patient presents with   Epistaxis    Sally Haas is a 26 y.o. female.  Patient is a 26 year old female with history of prior cholecystectomy, ectopic pregnancy with removal of fallopian tube, and tubal ligation.  Patient presenting today with complaints of nosebleed and abdominal pain.  She describes intermittent bleeding for the past 24 hours from the right nostril.  This began in the absence of any injury or trauma.  She has been able to control it with direct pressure, however this evening took longer to do so.  Patient also complaining of right upper quadrant and epigastric discomfort.  This started earlier today.  Pain is constant and worse when she moves.  She denies any nausea or vomiting.  She denies any diarrhea or constipation.  She denies urinary complaints.  Last menstrual period is current and ending.  The history is provided by the patient.       Home Medications Prior to Admission medications   Medication Sig Start Date End Date Taking? Authorizing Provider  acetaminophen (TYLENOL) 500 MG tablet Take by mouth. Patient not taking: Reported on 07/08/2021    [provider]  Blood Pressure Monitoring (BLOOD PRESSURE KIT) DEVI 1 Device by Does not apply route as needed. Patient not taking: Reported on 07/08/2021 07/16/19   Tresea Mall, CNM      Allergies    Patient has no known allergies.    Review of Systems   Review of Systems  All other systems reviewed and are negative.   Physical Exam Updated Vital Signs BP 127/79 (BP Location: Right Arm)   Pulse 90   Temp 99.3 F (37.4 C) (Oral)   Resp 18   Ht 5' 5"  (1.651 m)   Wt 90.7 kg   LMP 07/14/2021   SpO2 99%   BMI 33.28 kg/m  Physical Exam Vitals and nursing note reviewed.  Constitutional:      General: She is not in acute distress.    Appearance:  She is well-developed. She is not diaphoretic.  HENT:     Head: Normocephalic and atraumatic.     Nose: Nose normal.     Comments: The right nares is noted to have dried blood, but no active bleeding.  Septum is midline and there are no obvious abnormalities or obvious site of bleeding. Cardiovascular:     Rate and Rhythm: Normal rate and regular rhythm.     Heart sounds: No murmur heard.    No friction rub. No gallop.  Pulmonary:     Effort: Pulmonary effort is normal. No respiratory distress.     Breath sounds: Normal breath sounds. No wheezing.  Abdominal:     General: Bowel sounds are normal. There is no distension.     Palpations: Abdomen is soft.     Tenderness: There is abdominal tenderness. There is no right CVA tenderness or left CVA tenderness.     Comments: There is mild tenderness in the right upper quadrant.  There is no rebound or guarding.  Musculoskeletal:        General: Normal range of motion.     Cervical back: Normal range of motion and neck supple.  Skin:    General: Skin is warm and dry.  Neurological:     General: No focal deficit present.     Mental Status: She is alert  and oriented to person, place, and time.     ED Results / Procedures / Treatments   Labs (all labs ordered are listed, but only abnormal results are displayed) Labs Reviewed  CBC - Abnormal; Notable for the following components:      Result Value   Platelets 108 (*)    All other components within normal limits  LIPASE, BLOOD  COMPREHENSIVE METABOLIC PANEL  URINALYSIS, ROUTINE W REFLEX MICROSCOPIC  PREGNANCY, URINE  LIPASE, BLOOD    EKG None  Radiology No results found.  Procedures Procedures  {Document cardiac monitor, telemetry assessment procedure when appropriate:1}  Medications Ordered in ED Medications - No data to display  ED Course/ Medical Decision Making/ A&P                           Medical Decision Making Amount and/or Complexity of Data Reviewed Labs:  ordered.   ***  {Document critical care time when appropriate:1} {Document review of labs and clinical decision tools ie heart score, Chads2Vasc2 etc:1}  {Document your independent review of radiology images, and any outside records:1} {Document your discussion with family members, caretakers, and with consultants:1} {Document social determinants of health affecting pt's care:1} {Document your decision making why or why not admission, treatments were needed:1} Final Clinical Impression(s) / ED Diagnoses Final diagnoses:  None    Rx / DC Orders ED Discharge Orders     None

## 2021-07-22 NOTE — Discharge Instructions (Signed)
Use the Afrin nasal spray, 2 sprays 3 times daily for the next 3 days.  If the nosebleed recurs, tilts her head forward and pinch your nostrils shut for 15 minutes.  Take Prilosec 20 mg twice daily for the next 2 weeks.  Take Tylenol 1000 mg every 6 hours as needed for pain.  Return to the emergency department if you develop severe abdominal pain, high fever, bloody stools, or other new and concerning symptoms.

## 2021-07-22 NOTE — ED Notes (Signed)
Pt sitting up in bed awake and alert; no acute distress noted.  No active bleeding at this time - dried blood on outside of nares only.  RR even and unlabored on RA with symmetrical rise and fall of chest.  Pt reports RUQ intermittent pain with nausea; denies vomiting; denies diarrhea.  Reports last BM earlier in the day and was normal for patient.  Vitals stable - pt awaits labs/urine studies and provider.

## 2021-07-22 NOTE — ED Notes (Signed)
Pt awake and alert sitting up in bed- no active nosebleed since arrival; pt agreeable with d/c plan as discussed by provider- this nurse has verbally reinforced d/c instructions and provided pt with written copy-pt acknowledges verbal understanding and denies any additional questions, concerns, needs

## 2021-07-23 ENCOUNTER — Inpatient Hospital Stay: Payer: BC Managed Care – PPO | Attending: Oncology

## 2021-07-23 ENCOUNTER — Inpatient Hospital Stay (HOSPITAL_BASED_OUTPATIENT_CLINIC_OR_DEPARTMENT_OTHER): Payer: BC Managed Care – PPO | Admitting: Oncology

## 2021-07-23 VITALS — BP 103/72 | HR 96 | Temp 98.1°F | Resp 18 | Ht 65.0 in | Wt 209.3 lb

## 2021-07-23 DIAGNOSIS — D696 Thrombocytopenia, unspecified: Secondary | ICD-10-CM

## 2021-07-23 DIAGNOSIS — D72819 Decreased white blood cell count, unspecified: Secondary | ICD-10-CM | POA: Insufficient documentation

## 2021-07-23 DIAGNOSIS — Z79899 Other long term (current) drug therapy: Secondary | ICD-10-CM | POA: Diagnosis not present

## 2021-07-23 LAB — CBC WITH DIFFERENTIAL (CANCER CENTER ONLY)
Abs Immature Granulocytes: 0 10*3/uL (ref 0.00–0.07)
Basophils Absolute: 0 10*3/uL (ref 0.0–0.1)
Basophils Relative: 0 %
Eosinophils Absolute: 0.1 10*3/uL (ref 0.0–0.5)
Eosinophils Relative: 2 %
HCT: 39.2 % (ref 36.0–46.0)
Hemoglobin: 13 g/dL (ref 12.0–15.0)
Immature Granulocytes: 0 %
Lymphocytes Relative: 33 %
Lymphs Abs: 0.9 10*3/uL (ref 0.7–4.0)
MCH: 28.4 pg (ref 26.0–34.0)
MCHC: 33.2 g/dL (ref 30.0–36.0)
MCV: 85.8 fL (ref 80.0–100.0)
Monocytes Absolute: 0.3 10*3/uL (ref 0.1–1.0)
Monocytes Relative: 10 %
Neutro Abs: 1.6 10*3/uL — ABNORMAL LOW (ref 1.7–7.7)
Neutrophils Relative %: 55 %
Platelet Count: 132 10*3/uL — ABNORMAL LOW (ref 150–400)
RBC: 4.57 MIL/uL (ref 3.87–5.11)
RDW: 12.8 % (ref 11.5–15.5)
WBC Count: 2.8 10*3/uL — ABNORMAL LOW (ref 4.0–10.5)
nRBC: 0 % (ref 0.0–0.2)

## 2021-07-23 LAB — SAVE SMEAR(SSMR), FOR PROVIDER SLIDE REVIEW

## 2021-07-23 LAB — VITAMIN B12: Vitamin B-12: 315 pg/mL (ref 180–914)

## 2021-07-23 NOTE — Progress Notes (Signed)
[facility] New Patient Consult   Requesting MD: Jeani Sow, Md [address] [place],  [place] [address]   Sally Haas 26 y.o.  January 06, 1996    Reason for Consult: Thrombocytopenia   HPI: Sally Haas has a history of thrombocytopenia dating back at least to April 2021.  A CBC on 05/06/2019 found the hemoglobin at 13.7, platelets 120,000, white count 6.1, and ANC 3.8.  She had a CBC on 07/08/2021 and the platelets returned at 133,000.  Sally Haas reports frequent nosebleeds since she was in high school.  She had a nosebleed on 07/21/2021 and was seen in the emergency room.  The bleeding resolved spontaneously.  She also complained of abdominal discomfort with mild tenderness in the right upper abdomen.  The platelet count returned at 108,000.  A chemistry panel was unremarkable.  A urinalysis did not reveal evidence of infection.  She has a monthly menstrual cycle lasting 6-7 days and heavy for the first 2 days.  She reports easy bruising.  No other bleeding.  Past Medical History:  Diagnosis Date   Bipolar disease, chronic (HCC)    Ectopic pregnancy    Kidney stone    Thrombocytopenia (HCC)     .  G4 P2, 1 miscarriage, 1 ectopic pregnancy   .  COVID-19 approximate 1 year ago   .  Frequent nosebleeds   .  History of ocular migraines  Past Surgical History:  Procedure Laterality Date   DIAGNOSTIC LAPAROSCOPY WITH REMOVAL OF ECTOPIC PREGNANCY Right 05/07/2019   Procedure: Laparoscopic Removal Of Ectopic Pregnancy;  Surgeon: Reva Bores, MD;  Location: [facility] OR;  Service: Gynecology;  Laterality: Right;   LAPAROSCOPIC CHOLECYSTECTOMY     TUBAL LIGATION  03/2020    Medications: Reviewed  Allergies: No Known Allergies  Family history: Her paternal grandmother had colon cancer.  Her paternal grandmother and paternal grandfather had lung cancer and were smokers.  A maternal great aunt had breast cancer.  Social History:   She lives with her husband  and children in [place].  She is in business school.  She does not use cigarettes or alcohol.  No transfusion history.  No risk factor for HIV or hepatitis.  She received the first COVID-19 vaccine.  She has received a flu vaccine.  No recent COVID-vaccine.  ROS:   Positives include: Intermittent upper abdominal pain for the past 3 years with associated mild nausea, irregular bowel habits after undergoing a cholecystectomy procedure, frequent nosebleeds-last on 07/21/2021, frequent headaches, easy bruising  A complete ROS was otherwise negative.  Physical Exam:  Blood pressure 103/72, pulse 96, temperature 98.1 F (36.7 C), temperature source Oral, resp. rate 18, height 5\' 5"  (1.651 m), weight 209 lb 4.8 oz (94.9 kg), last menstrual period 07/14/2021, SpO2 100 %, unknown if currently breastfeeding.  HEENT: Oropharynx without visible mass, neck without mass Lungs: Clear bilaterally Cardiac: Regular rate and rhythm Abdomen: No hepatosplenomegaly, no mass, nontender  Vascular: No leg edema Lymph nodes: No cervical, supraclavicular, axillary, or inguinal nodes Neurologic: Alert and oriented, the motor exam appears intact in the upper and lower extremities bilaterally Skin: No rash, no ecchymoses or petechiae Musculoskeletal: No spine tenderness   LAB:  CBC  Lab Results  Component Value Date   WBC 2.8 (L) 07/23/2021   HGB 13.0 07/23/2021   HCT 39.2 07/23/2021   MCV 85.8 07/23/2021   PLT 132 (L) 07/23/2021   NEUTROABS PENDING 07/23/2021    Blood smear: The platelets appear  mildly decreased in number, few large platelets.  No platelet clumps.  The majority the white cells are mature neutrophils and lymphocytes.  Few bands.  No blasts or other young forms are seen.  Few ovalocytes, rare teardrop, mild increase in polychromasia.    CMP  Lab Results  Component Value Date   NA 137 07/21/2021   K 3.6 07/21/2021   CL 104 07/21/2021   CO2 24 07/21/2021   GLUCOSE 87 07/21/2021   BUN  13 07/21/2021   CREATININE 0.65 07/21/2021   CALCIUM 9.1 07/21/2021   PROT 7.6 07/21/2021   ALBUMIN 4.5 07/21/2021   AST 18 07/21/2021   ALT 18 07/21/2021   ALKPHOS 67 07/21/2021   BILITOT 0.8 07/21/2021   GFRNONAA >60 07/21/2021   GFRAA >60 07/28/2019      Assessment/Plan:   Mild thrombocytopenia  Leukopenia/mild neutropenia-new 07/23/2021 Frequent nosebleeds Report of easy bruising History of migraine headaches G4, P2, 1 miscarriage, 1 ectopic    Disposition:    SallyHaas is referred for evaluation of mild thrombocytopenia.  The platelet count is mildly low today.  The differential diagnosis includes a benign normal variant, chronic ITP, and possibly thrombocytopenia related to a von Willebrand variant.  She does not have an apparent associated collagen vascular disease.  She has mild neutropenia today.  I suspect this is a benign normal variant.  The white count has been in the low normal range in the past.  I have a low clinical suspicion for a hematopoietic malignancy.  She reports frequent nosebleeds including a nosebleed on 07/21/2021 prompting an emergency room visit.  We will check a von Willebrand panel at the next office visit.  Sally Haas will call for spontaneous bleeding or bruising.  She will return for an office visit, CBC, and von Willebrand panel in 3 months.  We will check a vitamin B12 level and ANA on the lab from today.  Thornton Papas, MD  07/23/2021, 1:59 PM

## 2021-07-24 ENCOUNTER — Telehealth: Payer: Self-pay | Admitting: Oncology

## 2021-07-24 LAB — ANTINUCLEAR ANTIBODIES, IFA: ANA Ab, IFA: NEGATIVE

## 2021-07-24 NOTE — Telephone Encounter (Signed)
Attempted to contact patient to schedule follow up visit from LOS 7/6 no answer so voicemail was left for patient to call back 

## 2021-07-27 ENCOUNTER — Telehealth: Payer: Self-pay | Admitting: Oncology

## 2021-07-27 ENCOUNTER — Other Ambulatory Visit: Payer: Self-pay | Admitting: *Deleted

## 2021-07-27 DIAGNOSIS — D696 Thrombocytopenia, unspecified: Secondary | ICD-10-CM

## 2021-07-27 NOTE — Telephone Encounter (Signed)
Attempted to contact patient to schedule follow up visit from LOS 7/6 no answer so voicemail was left for patient to call back

## 2021-07-29 ENCOUNTER — Encounter: Payer: Self-pay | Admitting: *Deleted

## 2021-09-08 ENCOUNTER — Ambulatory Visit: Payer: Medicaid Other

## 2021-10-12 ENCOUNTER — Encounter: Payer: Self-pay | Admitting: *Deleted

## 2021-10-23 ENCOUNTER — Inpatient Hospital Stay: Payer: BC Managed Care – PPO | Attending: Oncology

## 2021-10-23 ENCOUNTER — Inpatient Hospital Stay (HOSPITAL_BASED_OUTPATIENT_CLINIC_OR_DEPARTMENT_OTHER): Payer: BC Managed Care – PPO | Admitting: Oncology

## 2021-10-23 VITALS — BP 119/71 | HR 86 | Temp 98.1°F | Resp 18 | Ht 65.0 in | Wt 210.0 lb

## 2021-10-23 DIAGNOSIS — D72819 Decreased white blood cell count, unspecified: Secondary | ICD-10-CM | POA: Insufficient documentation

## 2021-10-23 DIAGNOSIS — D696 Thrombocytopenia, unspecified: Secondary | ICD-10-CM

## 2021-10-23 LAB — CBC WITH DIFFERENTIAL (CANCER CENTER ONLY)
Abs Immature Granulocytes: 0.01 10*3/uL (ref 0.00–0.07)
Basophils Absolute: 0 10*3/uL (ref 0.0–0.1)
Basophils Relative: 1 %
Eosinophils Absolute: 0 10*3/uL (ref 0.0–0.5)
Eosinophils Relative: 1 %
HCT: 40.6 % (ref 36.0–46.0)
Hemoglobin: 13.7 g/dL (ref 12.0–15.0)
Immature Granulocytes: 0 %
Lymphocytes Relative: 36 %
Lymphs Abs: 1.5 10*3/uL (ref 0.7–4.0)
MCH: 29 pg (ref 26.0–34.0)
MCHC: 33.7 g/dL (ref 30.0–36.0)
MCV: 85.8 fL (ref 80.0–100.0)
Monocytes Absolute: 0.3 10*3/uL (ref 0.1–1.0)
Monocytes Relative: 6 %
Neutro Abs: 2.4 10*3/uL (ref 1.7–7.7)
Neutrophils Relative %: 56 %
Platelet Count: 147 10*3/uL — ABNORMAL LOW (ref 150–400)
RBC: 4.73 MIL/uL (ref 3.87–5.11)
RDW: 13.1 % (ref 11.5–15.5)
WBC Count: 4.2 10*3/uL (ref 4.0–10.5)
nRBC: 0 % (ref 0.0–0.2)

## 2021-10-23 NOTE — Progress Notes (Signed)
  [facility] OFFICE PROGRESS NOTE   Diagnosis: Thrombocytopenia, leukopenia  INTERVAL HISTORY:   Sally Haas returns as scheduled.  She reports easy bruising has improved.  No fever or recent infection.  She has frequent migraine headaches.  The headache is relieved with Excedrin.  She has a right parieto-occipital headache several days per week.  Objective:  Vital signs in last 24 hours:  Blood pressure 119/71, pulse 86, temperature 98.1 F (36.7 C), temperature source Oral, resp. rate 18, height 5\' 5"  (1.651 m), weight 210 lb (95.3 kg), SpO2 98 %, unknown if currently breastfeeding.    HEENT: No tenderness at the right parietal/occipital scalp, neck without mass Lymphatics: No cervical, supraclavicular, or axillary nodes Resp: Lungs clear bilaterally Cardio: Regular rate and rhythm GI: No hepatosplenomegaly   Lab Results:  Lab Results  Component Value Date   WBC 4.2 10/23/2021   HGB 13.7 10/23/2021   HCT 40.6 10/23/2021   MCV 85.8 10/23/2021   PLT 147 (L) 10/23/2021   NEUTROABS 2.4 10/23/2021    CMP  Lab Results  Component Value Date   NA 137 07/21/2021   K 3.6 07/21/2021   CL 104 07/21/2021   CO2 24 07/21/2021   GLUCOSE 87 07/21/2021   BUN 13 07/21/2021   CREATININE 0.65 07/21/2021   CALCIUM 9.1 07/21/2021   PROT 7.6 07/21/2021   ALBUMIN 4.5 07/21/2021   AST 18 07/21/2021   ALT 18 07/21/2021   ALKPHOS 67 07/21/2021   BILITOT 0.8 07/21/2021   GFRNONAA >60 07/21/2021   GFRAA >60 07/28/2019     Medications: I have reviewed the patient's current medications.   Assessment/Plan: Mild thrombocytopenia  Leukopenia/mild neutropenia-new 07/23/2021  Frequent nosebleeds Report of easy bruising History of migraine headaches G4, P2, 1 miscarriage, 1 ectopic    Disposition: Sally Haas appears stable.  She has persistent mild thrombocytopenia.  The white count and neutrophils are normal today.  I suspect the hematologic findings are related to  benign normal variation.  An ANA was negative and a vitamin B12 level returned normal in July.  She will call for spontaneous bleeding or bruising.  She will return for an office visit and CBC in 8 months.  I recommended she follow-up with Dr. Cherlynn Kaiser for management of the frequent headaches.   Sally Coder, MD  10/23/2021  11:43 AM

## 2021-10-24 LAB — VON WILLEBRAND PANEL
Coagulation Factor VIII: 98 % (ref 56–140)
Ristocetin Co-factor, Plasma: 70 % (ref 50–200)
Von Willebrand Antigen, Plasma: 98 % (ref 50–200)

## 2021-10-24 LAB — COAG STUDIES INTERP REPORT

## 2021-10-26 ENCOUNTER — Encounter: Payer: Self-pay | Admitting: *Deleted

## 2021-12-31 ENCOUNTER — Encounter: Payer: Self-pay | Admitting: *Deleted

## 2022-02-20 ENCOUNTER — Encounter (HOSPITAL_BASED_OUTPATIENT_CLINIC_OR_DEPARTMENT_OTHER): Payer: Self-pay

## 2022-02-20 ENCOUNTER — Emergency Department (HOSPITAL_BASED_OUTPATIENT_CLINIC_OR_DEPARTMENT_OTHER)
Admission: EM | Admit: 2022-02-20 | Discharge: 2022-02-20 | Disposition: A | Discharge status: Home / Self Care | Payer: BC Managed Care – PPO | Attending: Emergency Medicine | Admitting: Emergency Medicine

## 2022-02-20 ENCOUNTER — Other Ambulatory Visit: Payer: Self-pay

## 2022-02-20 DIAGNOSIS — R197 Diarrhea, unspecified: Secondary | ICD-10-CM | POA: Insufficient documentation

## 2022-02-20 DIAGNOSIS — R1013 Epigastric pain: Secondary | ICD-10-CM | POA: Diagnosis not present

## 2022-02-20 DIAGNOSIS — R1011 Right upper quadrant pain: Secondary | ICD-10-CM | POA: Insufficient documentation

## 2022-02-20 DIAGNOSIS — R112 Nausea with vomiting, unspecified: Secondary | ICD-10-CM | POA: Diagnosis not present

## 2022-02-20 LAB — URINALYSIS, ROUTINE W REFLEX MICROSCOPIC
Bilirubin Urine: NEGATIVE
Glucose, UA: NEGATIVE mg/dL
Hgb urine dipstick: NEGATIVE
Ketones, ur: NEGATIVE mg/dL
Leukocytes,Ua: NEGATIVE
Nitrite: NEGATIVE
Specific Gravity, Urine: 1.023 (ref 1.005–1.030)
pH: 7.5 (ref 5.0–8.0)

## 2022-02-20 LAB — CBC WITH DIFFERENTIAL/PLATELET
Abs Immature Granulocytes: 0.01 10*3/uL (ref 0.00–0.07)
Basophils Absolute: 0 10*3/uL (ref 0.0–0.1)
Basophils Relative: 1 %
Eosinophils Absolute: 0 10*3/uL (ref 0.0–0.5)
Eosinophils Relative: 0 %
HCT: 40.2 % (ref 36.0–46.0)
Hemoglobin: 13.8 g/dL (ref 12.0–15.0)
Immature Granulocytes: 0 %
Lymphocytes Relative: 22 %
Lymphs Abs: 0.9 10*3/uL (ref 0.7–4.0)
MCH: 29.1 pg (ref 26.0–34.0)
MCHC: 34.3 g/dL (ref 30.0–36.0)
MCV: 84.6 fL (ref 80.0–100.0)
Monocytes Absolute: 0.3 10*3/uL (ref 0.1–1.0)
Monocytes Relative: 7 %
Neutro Abs: 2.9 10*3/uL (ref 1.7–7.7)
Neutrophils Relative %: 70 %
Platelets: 135 10*3/uL — ABNORMAL LOW (ref 150–400)
RBC: 4.75 MIL/uL (ref 3.87–5.11)
RDW: 13.3 % (ref 11.5–15.5)
WBC: 4.1 10*3/uL (ref 4.0–10.5)
nRBC: 0 % (ref 0.0–0.2)

## 2022-02-20 LAB — COMPREHENSIVE METABOLIC PANEL
ALT: 19 U/L (ref 0–44)
AST: 18 U/L (ref 15–41)
Albumin: 4.9 g/dL (ref 3.5–5.0)
Alkaline Phosphatase: 51 U/L (ref 38–126)
Anion gap: 11 (ref 5–15)
BUN: 8 mg/dL (ref 6–20)
CO2: 23 mmol/L (ref 22–32)
Calcium: 9.4 mg/dL (ref 8.9–10.3)
Chloride: 106 mmol/L (ref 98–111)
Creatinine, Ser: 0.55 mg/dL (ref 0.44–1.00)
GFR, Estimated: >60 mL/min (ref >60)
Glucose, Bld: 100 mg/dL — ABNORMAL HIGH (ref 70–99)
Potassium: 3.6 mmol/L (ref 3.5–5.1)
Sodium: 140 mmol/L (ref 135–145)
Total Bilirubin: 0.6 mg/dL (ref 0.3–1.2)
Total Protein: 7.9 g/dL (ref 6.5–8.1)

## 2022-02-20 LAB — LIPASE, BLOOD: Lipase: 11 U/L (ref 11–51)

## 2022-02-20 LAB — PREGNANCY, URINE: Preg Test, Ur: NEGATIVE

## 2022-02-20 MED ORDER — SODIUM CHLORIDE 0.9 % IV BOLUS
1000.0000 mL | Freq: Once | INTRAVENOUS | Status: AC
  Administered 2022-02-20: 1000 mL via INTRAVENOUS

## 2022-02-20 MED ORDER — MORPHINE SULFATE (PF) 4 MG/ML IV SOLN
4.0000 mg | Freq: Once | INTRAVENOUS | Status: AC
  Administered 2022-02-20: 4 mg via INTRAVENOUS
  Filled 2022-02-20: qty 1

## 2022-02-20 MED ORDER — ONDANSETRON HCL 4 MG/2ML IJ SOLN
4.0000 mg | Freq: Once | INTRAMUSCULAR | Status: AC
  Administered 2022-02-20: 4 mg via INTRAVENOUS
  Filled 2022-02-20: qty 2

## 2022-02-20 MED ORDER — SODIUM CHLORIDE 0.9 % IV BOLUS
1000.0000 mL | Freq: Once | INTRAVENOUS | Status: AC
Start: 2022-02-20 — End: 2022-02-20
  Administered 2022-02-20: 1000 mL via INTRAVENOUS

## 2022-02-20 MED ORDER — PANTOPRAZOLE SODIUM 40 MG IV SOLR
40.0000 mg | Freq: Once | INTRAVENOUS | Status: AC
  Administered 2022-02-20: 40 mg via INTRAVENOUS
  Filled 2022-02-20: qty 10

## 2022-02-20 NOTE — ED Triage Notes (Signed)
Patient here POV from Home.  Endorses Not eating well yesterday and then drinking heavily Last PM. Today the Patient endorses N/V/D.   Normally drinks socially. Also endorses some ABD Pain that has been present intermittently for 3 Weeks.   NAD Noted during Triage. A&Ox4. GCS 15. Ambulatory   .

## 2022-02-20 NOTE — ED Notes (Signed)
ED PA at [facility] 

## 2022-02-20 NOTE — ED Provider Notes (Signed)
[facility] Provider Note   CSN: [id number] Arrival date & time: 02/20/22  1114     History  Chief Complaint  Patient presents with   Vomiting    Sally Haas is a 27 y.o. female.  27 y.o female with a PMH  of cholecystectomy presents to the ED with a chief complaint of abdominal pain has been ongoing for the past 3 weeks.  Patient endorses pain along the right upper quadrant and epigastric region describing as a burning, stabbing sensation.  She does report alcohol intake yesterday which exacerbated her symptoms.  She does state drinking 3 beverages, 2 shots of liquor and did have an episode of nausea and vomiting.  She has had diarrhea for the last several weeks, described as a yellow consistency however last night it was somewhat dark.  She has not been taking any medication for improvement in her symptoms.  She did have a prior cholecystectomy, this somewhat failed and they had to go back in and repair one of the ducts as it had clogged once again.  She denies any urinary symptoms, no vaginal discharge, no chest pain or shortness of breath.  The history is provided by the patient and medical records.       Home Medications Prior to Admission medications   Medication Sig Start Date End Date Taking? Authorizing Provider  acetaminophen (TYLENOL) 500 MG tablet Take by mouth.    [provider]  Multiple Vitamin (MULTIVITAMIN) tablet Take 1 tablet by mouth daily.    [provider]      Allergies    Patient has no known allergies.    Review of Systems   Review of Systems  Constitutional:  Negative for chills and fever.  HENT:  Negative for sore throat.   Respiratory:  Negative for shortness of breath.   Cardiovascular:  Negative for chest pain.  Gastrointestinal:  Positive for abdominal pain, diarrhea, nausea and vomiting. Negative for blood in stool.  Genitourinary:  Negative for flank pain.  Musculoskeletal:   Negative for back pain.  Skin:  Negative for pallor and wound.  All other systems reviewed and are negative.   Physical Exam Updated Vital Signs BP 122/60 (BP Location: Right Arm)   Pulse 74   Temp 98 F (36.7 C) (Oral)   Resp 18   Ht 5' 5"$  (1.651 m)   Wt 95.3 kg   SpO2 100%   BMI 34.96 kg/m  Physical Exam Vitals and nursing note reviewed.  Constitutional:      General: She is not in acute distress.    Appearance: She is well-developed.  HENT:     Head: Normocephalic and atraumatic.     Mouth/Throat:     Mouth: Mucous membranes are dry.     Pharynx: No oropharyngeal exudate.     Comments: Oropharynx is dry Eyes:     Pupils: Pupils are equal, round, and reactive to light.  Cardiovascular:     Rate and Rhythm: Regular rhythm.     Heart sounds: Normal heart sounds.  Pulmonary:     Effort: Pulmonary effort is normal. No respiratory distress.     Breath sounds: Normal breath sounds.  Abdominal:     General: Bowel sounds are decreased. There is no distension.     Palpations: Abdomen is soft.     Tenderness: There is abdominal tenderness in the right upper quadrant and epigastric area. There is guarding.     Comments: RUQ pain  with palpation extending to the epigastric region  Musculoskeletal:        General: No tenderness or deformity.     Cervical back: Normal range of motion.     Right lower leg: No edema.     Left lower leg: No edema.  Skin:    General: Skin is warm and dry.  Neurological:     Mental Status: She is alert and oriented to person, place, and time.     ED Results / Procedures / Treatments   Labs (all labs ordered are listed, but only abnormal results are displayed) Labs Reviewed  CBC WITH DIFFERENTIAL/PLATELET - Abnormal; Notable for the following components:      Result Value   Platelets 135 (*)    All other components within normal limits  COMPREHENSIVE METABOLIC PANEL - Abnormal; Notable for the following components:   Glucose, Bld 100 (*)     All other components within normal limits  URINALYSIS, ROUTINE W REFLEX MICROSCOPIC - Abnormal; Notable for the following components:   Protein, ur TRACE (*)    All other components within normal limits  LIPASE, BLOOD  PREGNANCY, URINE    EKG None  Radiology No results found.  Procedures Procedures    Medications Ordered in ED Medications  sodium chloride 0.9 % bolus 1,000 mL (0 mLs Intravenous Stopped 02/20/22 1355)  ondansetron (ZOFRAN) injection 4 mg (4 mg Intravenous Given 02/20/22 1253)  morphine (PF) 4 MG/ML injection 4 mg (4 mg Intravenous Given 02/20/22 1253)  pantoprazole (PROTONIX) injection 40 mg (40 mg Intravenous Given 02/20/22 1253)  sodium chloride 0.9 % bolus 1,000 mL (1,000 mLs Intravenous New Bag/Given 02/20/22 1355)    ED Course/ Medical Decision Making/ A&P                             Medical Decision Making Amount and/or Complexity of Data Reviewed Labs: ordered.  Risk Prescription drug management.   This patient presents to the ED for concern of nausea, vomiting, diarrhea, this involves a number of treatment options, and is a complaint that carries with it a high risk of complications and morbidity.  The differential diagnosis includes pancreatis, gastritis versus gastroenteritis versus alcohol intoxication.     Co morbidities: Discussed in HPI   Brief History:  Patient here with nausea, vomiting, diarrhea that is been ongoing for the past 3 weeks, symptoms ulcerated yesterday after significant alcohol intake.  Prior history of cholecystectomy, did have clogged ducts after the surgery had to have a revision.  No fever, no suspicious food intake.  Pain is described as a burning sensation described as "exactly how it felt before had my gallbladder removed ".  No urinary symptoms, no chest pain, no shortness of breath.  EMR reviewed including pt PMHx, past surgical history and past visits to ER.   See HPI for more details   Lab Tests:  I ordered  and independently interpreted labs.  The pertinent results include:    I personally reviewed all laboratory work and imaging. Metabolic panel without any acute abnormality specifically kidney function within normal limits and no significant electrolyte abnormalities. CBC without leukocytosis or significant anemia.  Imaging Studies:  No imaging studies ordered for this patient    Medicines ordered:  I ordered medication including zofran, Protonix, fluids for symptomatic treatment Reevaluation of the patient after these medicines showed that the patient improved I have reviewed the patients home medicines and have made adjustments as  needed  Reevaluation:  After the interventions noted above I re-evaluated patient and found that they have :improved   Social Determinants of Health:  The patient's social determinants of health were a factor in the care of this patient  Problem List / ED Course:  Patient presents to the ED with a chief complaint of nausea began yesterday.  Has had right upper quadrant abdominal pain for about 3 weeks.  Does have a prior history of cholecystectomy, she reports feeling the same pain when she had her gallbladder removed.  On evaluation there is some guarding on my exam, tenderness to palpation.  No urinary symptoms.  Labs obtained by me reveal a CBC with no leukocytosis, globin is within normal limits.  CMP without any electrolyte derangement, current levels within normal limits.  LFTs are unremarkable.  Pregnancy test is negative.  Lipase level is normal.  UA with trace of protein, no nitrites or leukocytes.  Given 1 L bolus reassessment she does report improvement in her symptoms after morphine and Zofran.  Patient did have alcohol intake yesterday, reports this is likely exacerbating her symptoms.  I do not suspect that patient needs any acute imaging at this time. Chart review does show prior visits for right upper quadrant pain after her gallbladder was  removed, I do feel that this is somewhat acute on chronic, given a second liter bolus to help with hydration as patient did have some nausea, alcohol intake and ongoing diarrhea for the last couple of days.  Labs are not consistent with dehydration but treating her symptoms overall.  We did discuss appropriate follow-up with gastroenterology, will need further evaluation with perhaps endoscopy if she continues to have this pain.  She is agreeable to plan treatment, patient is hemodynamically stable for discharge.  Dispostion:  Discussed appropriate follow-up with primary care, patient is agreeable of treatment.   Portions of this note were generated with [employer]. Dictation errors may occur despite best attempts at proofreading.   Final Clinical Impression(s) / ED Diagnoses Final diagnoses:  Nausea and vomiting, unspecified vomiting type    Rx / DC Orders ED Discharge Orders     None         Janeece Fitting, PA-C 02/20/22 [place], DO 02/26/22 [phone]

## 2022-02-20 NOTE — Discharge Instructions (Signed)
Laboratory results are within normal limits today.  You may follow-up with your primary care physician or to obtain further management of the ongoing right upper quadrant pain.  Experience any worsening symptoms return to emergency department for further evaluation.

## 2022-02-20 NOTE — ED Notes (Signed)
Tolerating PO fluids. No emesis. Denies nausea. Pain decreased.

## 2022-02-20 NOTE — ED Notes (Signed)
We get her some water at this time per her request.

## 2022-02-23 ENCOUNTER — Other Ambulatory Visit (HOSPITAL_BASED_OUTPATIENT_CLINIC_OR_DEPARTMENT_OTHER): Payer: Self-pay

## 2022-02-23 ENCOUNTER — Encounter: Payer: Self-pay | Admitting: Family Medicine

## 2022-02-23 ENCOUNTER — Ambulatory Visit (INDEPENDENT_AMBULATORY_CARE_PROVIDER_SITE_OTHER): Payer: BC Managed Care – PPO | Admitting: Family Medicine

## 2022-02-23 VITALS — BP 118/76 | HR 78 | Temp 97.9°F | Ht 65.0 in | Wt 206.0 lb

## 2022-02-23 DIAGNOSIS — R1084 Generalized abdominal pain: Secondary | ICD-10-CM

## 2022-02-23 LAB — H. PYLORI ANTIBODY, IGG: H Pylori IgG: NEGATIVE

## 2022-02-23 LAB — LIPASE: Lipase: 18 U/L (ref 11.0–59.0)

## 2022-02-23 LAB — AMYLASE: Amylase: 24 U/L — ABNORMAL LOW (ref 27–131)

## 2022-02-23 LAB — C-REACTIVE PROTEIN: CRP: <1 mg/dL (ref 0.5–20.0)

## 2022-02-23 LAB — SEDIMENTATION RATE: Sed Rate: 4 mm/hr (ref 0–20)

## 2022-02-23 MED ORDER — CHOLESTYRAMINE LIGHT 4 G PO PACK
4.0000 g | PACK | Freq: Two times a day (BID) | ORAL | 2 refills | Status: DC
  Filled 2022-02-23: qty 60, 30d supply, fill #0

## 2022-02-23 MED ORDER — OMEPRAZOLE 40 MG PO CPDR
40.0000 mg | DELAYED_RELEASE_CAPSULE | Freq: Every day | ORAL | 3 refills | Status: DC
  Filled 2022-02-23: qty 30, 30d supply, fill #0

## 2022-02-23 MED ORDER — DICYCLOMINE HCL 10 MG PO CAPS
10.0000 mg | ORAL_CAPSULE | Freq: Three times a day (TID) | ORAL | 1 refills | Status: DC
  Filled 2022-02-23: qty 120, 30d supply, fill #0

## 2022-02-23 NOTE — Patient Instructions (Addendum)
It was very nice to see you today!  [phone]  Pick up meds  Worse, vomiting, fevers-to ER   PLEASE NOTE:  If you had any lab tests please let us know if you have not heard back within a few days. You may see your results on MyChart before we have a chance to review them but we will give you a call once they are reviewed by [place]. If we ordered any referrals today, please let us know if you have not heard from their office within the next week.   Please try these tips to maintain a healthy lifestyle:  Eat most of your calories during the day when you are active. Eliminate processed foods including packaged sweets (pies, cakes, cookies), reduce intake of potatoes, white bread, white pasta, and white rice. Look for whole grain options, oat flour or almond flour.  Each meal should contain half fruits/vegetables, one quarter protein, and one quarter carbs (no bigger than a computer mouse).  Cut down on sweet beverages. This includes juice, soda, and sweet tea. Also watch fruit intake, though this is a healthier sweet option, it still contains natural sugar! Limit to 3 servings daily.  Drink at least 1 glass of water with each meal and aim for at least 8 glasses per day  Exercise at least 150 minutes every week.

## 2022-02-23 NOTE — Progress Notes (Signed)
Subjective:     Patient ID: Sally Haas, female    DOB: November 17, 1995, 27 y.o.   MRN: [id number]  Chief Complaint  Patient presents with   Follow-up    Follow-up from ED visit on 02/20/22 for severe abdominal pain Pain comes and goes along with diarrhea, stabbing/sharp pain when eating that started months ago, over a year ago, getting worse    HPI-here w/husb Went to ED on 2/3 for severe abd pain.  Intermitt.  Gets diarrhea. Burning stabbing pain, intermitt. RUQ and mid epi area. Cholecystectomy in past.similar to how felt before GB removed.  ER-labs unremarkable x plt 135-baseline. Preg neg(TL as well). Rec GI referral  Abd pain for years, chol in 2019.  Works in [employer]. Intense cramps, can be sharp/purning that lasts at least 20min or hours. When eats, bright yellow diarrhea-past 1 yr. Past 1 mo, more intense pains and daily.  If goes to bathroom-may get diarrhea and burns the rectum after.  Won't eat till 5-6pm as concerned w/dealing w/the diarrhea.   No vomiting.  Has lost few pounds  There are no preventive care reminders to display for this patient.   Past Medical History:  Diagnosis Date   Bipolar disease, chronic (HCC)    Ectopic pregnancy    Kidney stone    Thrombocytopenia [facility])     Past Surgical History:  Procedure Laterality Date   DIAGNOSTIC LAPAROSCOPY WITH REMOVAL OF ECTOPIC PREGNANCY Right 05/07/2019   Procedure: Laparoscopic Removal Of Ectopic Pregnancy;  Surgeon: Donnamae Jude, MD;  Location: [facility];  Service: Gynecology;  Laterality: Right;   LAPAROSCOPIC CHOLECYSTECTOMY     TUBAL LIGATION  03/2020    Outpatient Medications Prior to Visit  Medication Sig Dispense Refill   acetaminophen (TYLENOL) 500 MG tablet Take by mouth.     Multiple Vitamin (MULTIVITAMIN) tablet Take 1 tablet by mouth daily.     No facility-administered medications prior to visit.    No Known Allergies ROS neg/noncontributory except as noted HPI/below      Objective:      BP 118/76   Pulse 78   Temp 97.9 F (36.6 C) (Temporal)   Ht 5\' 5"  (1.651 m)   Wt 206 lb (93.4 kg)   LMP 02/15/2022 (Exact Date)   SpO2 98%   Breastfeeding No   BMI 34.28 kg/m  Wt Readings from Last 3 Encounters:  02/23/22 206 lb (93.4 kg)  02/20/22 210 lb 1.6 oz (95.3 kg)  10/23/21 210 lb (95.3 kg)    Physical Exam   Gen: WDWN NAD HEENT: NCAT, conjunctiva not injected, sclera nonicteric NECK:  supple, no thyromegaly, no nodes, no carotid bruits CARDIAC: RRR, S1S2+, no murmur. DP 2+B LUNGS: CTAB. No wheezes ABDOMEN:  BS+, soft,mod tender diffusely, No HSM, no masses. No CVAT EXT:  no edema MSK: no gross abnormalities.  NEURO: A&O x3.  CN II-XII intact.  PSYCH: normal mood. Good eye contact  Reviewed ER notes, and CT from 04/2021     Assessment & Plan:   Problem List Items Addressed This Visit   None Visit Diagnoses     Generalized abdominal pain    -  Primary   Relevant Orders   H. pylori antibody, IgG   Amylase   Lipase   C-reactive protein   Sedimentation rate   Ambulatory referral to Gastroenterology      Abd pain-getting worse and more intense.  ?scar tissue, GERD, H pylori, IBS, post chole, other.  Refer GI.  Check h pylori, amylase, lipase, crp, esr.  Cholestyramine light 4g pk, bid, bently 10mg  qid prn, omeprazole 40mg  daily.  Worse, uncontrollable vomiting, ER.  Meds ordered this encounter  Medications   omeprazole (PRILOSEC) 40 MG capsule    Sig: Take 1 capsule (40 mg total) by mouth daily.    Dispense:  30 capsule    Refill:  3   cholestyramine light (PREVALITE) 4 g packet    Sig: Take 1 packet (4 g total) by mouth 2 (two) times daily.    Dispense:  60 each    Refill:  2   dicyclomine (BENTYL) 10 MG capsule    Sig: Take 1 capsule (10 mg total) by mouth 4 (four) times daily -  before meals and at bedtime.    Dispense:  120 capsule    Refill:  1    Wellington Hampshire, MD

## 2022-03-03 ENCOUNTER — Other Ambulatory Visit (HOSPITAL_BASED_OUTPATIENT_CLINIC_OR_DEPARTMENT_OTHER): Payer: Self-pay

## 2022-03-23 ENCOUNTER — Encounter: Payer: Self-pay | Admitting: Gastroenterology

## 2022-05-04 ENCOUNTER — Telehealth: Payer: BC Managed Care – PPO | Admitting: Nurse Practitioner

## 2022-05-04 DIAGNOSIS — N898 Other specified noninflammatory disorders of vagina: Secondary | ICD-10-CM

## 2022-05-04 MED ORDER — FLUCONAZOLE 150 MG PO TABS: 150.0000 mg | ORAL_TABLET | Freq: Once | ORAL | 0 refills | Status: AC

## 2022-05-04 NOTE — Progress Notes (Signed)

## 2022-05-14 ENCOUNTER — Encounter: Payer: Self-pay | Admitting: Gastroenterology

## 2022-05-14 ENCOUNTER — Encounter: Payer: Self-pay | Admitting: Family Medicine

## 2022-05-14 ENCOUNTER — Ambulatory Visit: Payer: BC Managed Care – PPO | Admitting: Gastroenterology

## 2022-05-14 ENCOUNTER — Ambulatory Visit (INDEPENDENT_AMBULATORY_CARE_PROVIDER_SITE_OTHER): Payer: BC Managed Care – PPO | Admitting: Family Medicine

## 2022-05-14 VITALS — BP 140/80 | HR 88 | Temp 98.6°F | Resp 16 | Ht 65.0 in | Wt 192.1 lb

## 2022-05-14 VITALS — BP 122/76 | HR 80 | Ht 65.0 in | Wt 190.0 lb

## 2022-05-14 DIAGNOSIS — G44229 Chronic tension-type headache, not intractable: Secondary | ICD-10-CM | POA: Diagnosis not present

## 2022-05-14 DIAGNOSIS — R1031 Right lower quadrant pain: Secondary | ICD-10-CM | POA: Diagnosis not present

## 2022-05-14 DIAGNOSIS — K529 Noninfective gastroenteritis and colitis, unspecified: Secondary | ICD-10-CM

## 2022-05-14 DIAGNOSIS — R110 Nausea: Secondary | ICD-10-CM | POA: Diagnosis not present

## 2022-05-14 DIAGNOSIS — R221 Localized swelling, mass and lump, neck: Secondary | ICD-10-CM | POA: Diagnosis not present

## 2022-05-14 DIAGNOSIS — M25511 Pain in right shoulder: Secondary | ICD-10-CM

## 2022-05-14 DIAGNOSIS — R634 Abnormal weight loss: Secondary | ICD-10-CM | POA: Diagnosis not present

## 2022-05-14 DIAGNOSIS — G4489 Other headache syndrome: Secondary | ICD-10-CM

## 2022-05-14 DIAGNOSIS — R519 Headache, unspecified: Secondary | ICD-10-CM

## 2022-05-14 MED ORDER — NA SULFATE-K SULFATE-MG SULF 17.5-3.13-1.6 GM/177ML PO SOLN: 1.0000 | Freq: Once | ORAL | 0 refills | Status: AC

## 2022-05-14 MED ORDER — HYOSCYAMINE SULFATE 0.125 MG PO TABS: ORAL_TABLET | ORAL | 1 refills | Status: DC

## 2022-05-14 MED ORDER — PREDNISONE 20 MG PO TABS: 40.0000 mg | ORAL_TABLET | Freq: Every day | ORAL | 0 refills | Status: AC

## 2022-05-14 NOTE — Patient Instructions (Addendum)
It was very nice to see you today!  Decrease caffeine  Use ibuprofen or aleve.  Bernita Raisin for "weird" headache  Psychiatrist Medicine at [facility]  [address] on the 1st floor Phone number [phone]    PLEASE NOTE:  If you had any lab tests please let us know if you have not heard back within a few days. You may see your results on MyChart before we have a chance to review them but we will give you a call once they are reviewed by [place]. If we ordered any referrals today, please let us know if you have not heard from their office within the next week.   Please try these tips to maintain a healthy lifestyle:  Eat most of your calories during the day when you are active. Eliminate processed foods including packaged sweets (pies, cakes, cookies), reduce intake of potatoes, white bread, white pasta, and white rice. Look for whole grain options, oat flour or almond flour.  Each meal should contain half fruits/vegetables, one quarter protein, and one quarter carbs (no bigger than a computer mouse).  Cut down on sweet beverages. This includes juice, soda, and sweet tea. Also watch fruit intake, though this is a healthier sweet option, it still contains natural sugar! Limit to 3 servings daily.  Drink at least 1 glass of water with each meal and aim for at least 8 glasses per day  Exercise at least 150 minutes every week.

## 2022-05-14 NOTE — Patient Instructions (Signed)
_______________________________________________________  If your blood pressure at your visit was 140/90 or greater, please contact your primary care physician to follow up on this.  _______________________________________________________  If you are age 25 or older, your body mass index should be between 23-30. Your Body mass index is 31.62 kg/m. If this is out of the aforementioned range listed, please consider follow up with your Primary Care Provider.  If you are age 51 or younger, your body mass index should be between 19-25. Your Body mass index is 31.62 kg/m. If this is out of the aformentioned range listed, please consider follow up with your Primary Care Provider.   ________________________________________________________  The [place] [facility] providers would like to encourage you to use [facility] to communicate with providers for non-urgent requests or questions.  Due to long hold times on the telephone, sending your provider a message by [facility] may be a faster and more efficient way to get a response.  Please allow 48 business hours for a response.  Please remember that this is for non-urgent requests.  _______________________________________________________  Due to recent changes in healthcare laws, you may see the results of your imaging and laboratory studies on MyChart before your provider has had a chance to review them.  We understand that in some cases there may be results that are confusing or concerning to you. Not all laboratory results come back in the same time frame and the provider may be waiting for multiple results in order to interpret others.  Please give [place] 48 hours in order for your provider to thoroughly review all the results before contacting the office for clarification of your results.   You have been scheduled for an endoscopy and colonoscopy. Please follow the written instructions given to you at your visit today. Please pick up your prep supplies at the pharmacy  within the next 1-3 days. If you use inhalers (even only as needed), please bring them with you on the day of your procedure.   It was a pleasure to see you today!  Thank you for trusting me with your gastrointestinal care!

## 2022-05-14 NOTE — Progress Notes (Unsigned)
Subjective:     Patient ID: Sally Haas, female    DOB: Jan 27, 1995, 27 y.o.   MRN: [id number]  Chief Complaint  Patient presents with   Shoulder Pain    Right shoulder pain that started about 2 weeks ago, painful to lift    Headache    Recurrent headaches, getting worse over the last 4 months    HPI  Right shoulder pan for 2 weeks-was lying in bed and pushed up to sit up and  strange pain.  Some days hard to lift and carry things. Intnsity varies.  Some achey but worse to lift it up.  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  Felt weak few days ago. Some pain to sleep on side. No n/t  some possibly decreased grip. Some chronic tension in neck.  Has been taking excedrine-helps some Headache(s)-daily for long time.  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  Excedrin would work, but then some tension headache(s) on right, and now new headache(s) on top of head and pressure. And worse to bend over and stand up-has had 1-2 times in past, but more frequently in past 4 months.  1-2/week(s).  Will last all day-only sleep helps, but then gone for 1-2 weeks.   Tension type headache(s) more on right side and neck. There most days.  Takes 1000mg /D or twice daily of Excedrin. Drinks a lot of caffeine.  Occasional headache(s) w/orgasm.  There are no preventive care reminders to display for this patient.  Past Medical History:  Diagnosis Date   Bipolar disease, chronic (HCC)    Ectopic pregnancy    Kidney stone    Thrombocytopenia [facility])     Past Surgical History:  Procedure Laterality Date   DIAGNOSTIC LAPAROSCOPY WITH REMOVAL OF ECTOPIC PREGNANCY Right 05/07/2019   Procedure: Laparoscopic Removal Of Ectopic Pregnancy;  Surgeon: Reva Bores, MD;  Location: [facility] OR;  Service: Gynecology;  Laterality: Right;   LAPAROSCOPIC CHOLECYSTECTOMY     TUBAL LIGATION  03/2020     Current Outpatient Medications:    acetaminophen (TYLENOL) 500 MG tablet,  Take by mouth., Disp: , Rfl:    aspirin-acetaminophen-caffeine (EXCEDRIN MIGRAINE) 250-250-65 MG tablet, Take by mouth every 6 (six) hours as needed for headache., Disp: , Rfl:    predniSONE (DELTASONE) 20 MG tablet, Take 2 tablets (40 mg total) by mouth daily with breakfast for 5 days., Disp: 10 tablet, Rfl: 0   hyoscyamine (LEVSIN) 0.125 MG tablet, One every 6-8 hours as needed (Patient not taking: Reported on 05/14/2022), Disp: 60 tablet, Rfl: 1   Multiple Vitamin (MULTIVITAMIN) tablet, Take 1 tablet by mouth daily. (Patient not taking: Reported on 05/14/2022), Disp: , Rfl:   No Known Allergies ROS neg/noncontributory except as noted HPI/below      Objective:     BP (!) 140/80   Pulse 88   Temp 98.6 F (37 C) (Temporal)   Resp 16   Ht 5\' 5"  (1.651 m)   Wt 192 lb 2 oz (87.1 kg)   LMP 04/14/2022 (Exact Date)   SpO2 98%   BMI 31.97 kg/m  Wt Readings from Last 3 Encounters:  05/14/22 192 lb 2 oz (87.1 kg)  05/14/22 190 lb (86.2 kg)  02/23/22 206 lb (93.4 kg)    Physical Exam   Gen: WDWN NAD HEENT: NCAT, conjunctiva not injected, sclera nonicteric fundi benign NECK:  supple, no thyromegaly, no nodes, no carotid bruits + fullness right neck  lateral to thyroid CARDIAC: RRR, S1S2+, no murmur. DP 2+B EXT:  no edema MSK: right shoulder-tight muscles no TTP.  Pain w/rotation and abduction.  Some clicking in joint  NEURO: A&O x3.  CN II-XII intact. F-N, RAM, pronator drift normal.  PSYCH: normal mood. Good eye contact     Assessment & Plan:  Chronic tension-type headache, not intractable -     MR BRAIN WO CONTRAST; Future -     MR ANGIO HEAD WO CONTRAST; Future  Headache above the eye region -     MR BRAIN WO CONTRAST; Future -     MR ANGIO HEAD WO CONTRAST; Future  Mass of right side of neck -     CT SOFT TISSUE NECK W CONTRAST; Future  Acute pain of right shoulder  Other orders -     predniSONE; Take 2 tablets (40 mg total) by mouth daily with breakfast for 5 days.   Dispense: 10 tablet; Refill: 0  1.  Right-sided tension type headaches-chronic but more frequent.  She is using too much caffeine.  Uses Excedrin Migraine at least once to twice daily.  Advised that she may have caffeine headaches, medication over use headaches, and muscle tension.  Stretches of the neck.  Start to wean caffeine.  Take magnesium 200 to 400 mg daily.  Will check MRI and MR a of brain see below 2.  New headaches above the right eye-these are worsened with bending over, squatting, orgasm.  Not sure if partly related to #1, vascular, different migraine, brain aneurysm.  Will check MRI/MRA of head.  Samples of Ubrelvy given to the patient to use if she gets 1 of these headaches.  Keep a log 3.  Mass on right side of neck-not sure if just muscle spasm, soft tissue mass, lymph node, extension of thyroid.  Will check CT of the neck 4.  Pain of right shoulder-not sure if just strain or possible rotator cuff strain-will do prednisone 40 mg daily x 5 days.  Contact information given for [facility]-follow-up with them  Follow-up 1 month  Angelena Sole, MD

## 2022-05-14 NOTE — Progress Notes (Signed)
[facility] Consult Note:  History: Sally Haas 05/14/2022  Referring provider: Jeani Sow, MD  Reason for consult/chief complaint: Abdominal Pain (Pt states she is having intermittent sharp and dull abd pain, pt been having problems for over a year but has worsen over the last 3 months), Diarrhea (Pt is having chronic diarrhea ), and Nausea (Pt has recently started being nauseas )   Subjective  HPI: Sally Haas was referred to [place] by her PCP after visit in early February few days after ED visit for abdominal pain.  Symptoms were characterized as follows: "Went to ED on 2/3 for severe abd pain.  Intermitt.  Gets diarrhea. Burning stabbing pain, intermitt. RUQ and mid epi area. Cholecystectomy in past.similar to how felt before GB removed.  ER-labs unremarkable x plt 135-baseline. Preg neg(TL as well). Rec GI referral   Abd pain for years, chol in 2019.  Works in [employer]. Intense cramps, can be sharp/purning that lasts at least or hours. When eats, bright yellow diarrhea-past 1 yr. Past 1 mo, more intense pains and daily.  If goes to bathroom-may get diarrhea and burns the rectum after.  Won't eat till 5-6pm as concerned w/dealing w/the diarrhea.   No vomiting.  Has lost few pounds"  Additional lab work performed and patient given prescriptions for omeprazole, cholestyramine and dicyclomine.  (She never got around to filling the prescriptions)  _________________________________________________   This is a very pleasant 27 year old woman referred by primary care to see [place] for multiple digestive symptoms noted above.  She has had years of some intermittent crampy abdominal pain or tendency to loose stool ever since her cholecystectomy.  However, that would be situational with certain fatty or greasy food triggers and that she would feel well after the episode passes.  Over the last year, and more so in the last several months, she has had worsening  abdominal pain.  Characterized as burning or sharp acute onset pain that might be in the epigastrium more often on the right side.  Some of it feels reminiscent of symptoms before her cholecystectomy.  She also frequently has urgent need for BM very shortly after eating, sometimes even within 5 to 10 minutes.  Stool typically loose but nonbloody, sometimes several times a day.  All of the symptoms have been accompanied by nausea and are leading to food avoidance and perhaps a 20 pound weight loss in the last couple of months.   ROS:  Review of Systems  Constitutional:  Negative for appetite change and unexpected weight change.  HENT:  Negative for mouth sores and voice change.   Eyes:  Negative for pain and redness.  Respiratory:  Negative for cough and shortness of breath.   Cardiovascular:  Negative for chest pain and palpitations.  Genitourinary:  Negative for dysuria and hematuria.  Musculoskeletal:  Negative for arthralgias and myalgias.  Skin:  Negative for pallor and rash.  Neurological:  Negative for weakness and headaches.  Hematological:  Negative for adenopathy.   No episodes of painful eye redness, no rash no mouth sores  Past Medical History: Past Medical History:  Diagnosis Date   Bipolar disease, chronic (HCC)    Ectopic pregnancy    Kidney stone    Thrombocytopenia [facility])      Past Surgical History: Past Surgical History:  Procedure Laterality Date   DIAGNOSTIC LAPAROSCOPY WITH REMOVAL OF ECTOPIC PREGNANCY Right 05/07/2019   Procedure: Laparoscopic Removal Of Ectopic Pregnancy;  Surgeon: Reva Bores, MD;  Location: [facility] OR;  Service: Gynecology;  Laterality: Right;   LAPAROSCOPIC CHOLECYSTECTOMY     TUBAL LIGATION  03/2020     Family History: Family History  Problem Relation Age of Onset   Cancer Mother    Alcohol abuse Father    Diabetes Paternal Aunt    Cancer Maternal Grandmother    Cancer Paternal Grandmother    Cancer Paternal Grandfather      Social History: Social History   Socioeconomic History   Marital status: Married    Spouse name: Not on file   Number of children: 2   Years of education: Not on file   Highest education level: Associate degree: academic program  Occupational History   Occupation: Art therapist  Tobacco Use   Smoking status: Never   Smokeless tobacco: Never  Vaping Use   Vaping Use: Never used  Substance and Sexual Activity   Alcohol use: Not Currently   Drug use: Never   Sexual activity: Yes    Birth control/protection: Surgical, None  Other Topics Concern   Not on file  Social History Narrative   Student and part time work-business.  Working [place].     Social Determinants of Health   Financial Resource Strain: Low Risk  (05/13/2022)   Overall Financial Resource Strain (CARDIA)    Difficulty of Paying Living Expenses: Not hard at all  Food Insecurity: No Food Insecurity (05/13/2022)   Hunger Vital Sign    Worried About Running Out of Food in the Last Year: Never true    Ran Out of Food in the Last Year: Never true  Transportation Needs: No Transportation Needs (05/13/2022)   PRAPARE - Administrator, Civil Service (Medical): No    Lack of Transportation (Non-Medical): No  Physical Activity: Sufficiently Active (05/13/2022)   Exercise Vital Sign    Days of Exercise per Week: 5 days    Minutes of Exercise per Session: 100 min  Stress: No Stress Concern Present (05/13/2022)   [employer] of Occupational Health - Occupational Stress Questionnaire    Feeling of Stress : Only a little  Social Connections: Moderately Isolated (05/13/2022)   Social Connection and Isolation Panel [NHANES]    Frequency of Communication with Friends and Family: More than three times a week    Frequency of Social Gatherings with Friends and Family: Once a week    Attends Religious Services: Never    Database administrator or Organizations: No    Attends Engineer, structural: Not  on file    Marital Status: Married   Married to her high school Biomedical engineer, manages a Nature conservation officer  Allergies: No Known Allergies  Outpatient Meds: Current Outpatient Medications  Medication Sig Dispense Refill   hyoscyamine (LEVSIN) 0.125 MG tablet One every 6-8 hours as needed 60 tablet 1   Na Sulfate-K Sulfate-Mg Sulf 17.5-3.13-1.6 GM/177ML SOLN Take 1 kit by mouth once for 1 dose. 354 mL 0   acetaminophen (TYLENOL) 500 MG tablet Take by mouth. (Patient not taking: Reported on 05/14/2022)     cholestyramine light (PREVALITE) 4 g packet Take 1 packet (4 g total) by mouth 2 (two) times daily. (Patient not taking: Reported on 05/14/2022) 60 each 2   dicyclomine (BENTYL) 10 MG capsule Take 1 capsule (10 mg total) by mouth 4 (four) times daily -  before meals and at bedtime. (Patient not taking: Reported on 05/14/2022) 120 capsule 1   Multiple Vitamin (MULTIVITAMIN) tablet Take 1 tablet by mouth  daily. (Patient not taking: Reported on 05/14/2022)     omeprazole (PRILOSEC) 40 MG capsule Take 1 capsule (40 mg total) by mouth daily. (Patient not taking: Reported on 05/14/2022) 30 capsule 3   No current facility-administered medications for this visit.      ___________________________________________________________________ Objective   Exam:  BP 122/76   Pulse 80   Ht 5\' 5"  (1.651 m)   Wt 190 lb (86.2 kg)   BMI 31.62 kg/m  Wt Readings from Last 3 Encounters:  05/14/22 190 lb (86.2 kg)  02/23/22 206 lb (93.4 kg)  02/20/22 210 lb 1.6 oz (95.3 kg)  (Note 20 pound weight loss since early February)  General: Well-appearing, no muscle wasting Eyes: sclera anicteric, no redness ENT: oral mucosa moist without lesions, no cervical or supraclavicular lymphadenopathy CV: Regular without appreciable murmur, no JVD, no peripheral edema Resp: clear to auscultation bilaterally, normal RR and effort noted GI: soft, no bruit, RLQ tenderness, with active bowel sounds. No guarding or  palpable organomegaly noted. Skin; warm and dry, no rash or jaundice noted Neuro: awake, alert and oriented x 3. Normal gross motor function and fluent speech  Labs:     Latest Ref Rng & Units 02/20/2022   11:38 AM 10/23/2021   11:15 AM 07/23/2021    1:22 PM  CBC  WBC 4.0 - 10.5 K/uL 4.1  4.2  2.8   Hemoglobin 12.0 - 15.0 g/dL 16.1  09.6  04.5   Hematocrit 36.0 - 46.0 % 40.2  40.6  39.2   Platelets 150 - 400 K/uL 135  147  132     Thrombocytopenia present and stable for years     Latest Ref Rng & Units 02/20/2022   11:38 AM 07/21/2021   11:02 PM 07/08/2021   10:22 AM  CMP  Glucose 70 - 99 mg/dL 409  87  90   BUN 6 - 20 mg/dL 8  13  15    Creatinine 0.44 - 1.00 mg/dL 8.11  9.14  7.82   Sodium 135 - 145 mmol/L 140  137  137   Potassium 3.5 - 5.1 mmol/L 3.6  3.6  4.2   Chloride 98 - 111 mmol/L 106  104  102   CO2 22 - 32 mmol/L 23  24  25    Calcium 8.9 - 10.3 mg/dL 9.4  9.1  9.4   Total Protein 6.5 - 8.1 g/dL 7.9  7.6  7.8   Total Bilirubin 0.3 - 1.2 mg/dL 0.6  0.8  0.5   Alkaline Phos 38 - 126 U/L 51  67  64   AST 15 - 41 U/L 18  18  21    ALT 0 - 44 U/L 19  18  18     Normal amylase and lipase  Normal CRP  Negative pregnancy test 02/20/2022 (ED visit)  Negative H. pylori IgG antibody   Radiologic Studies: 2023 CT abdomen and pelvis report  CLINICAL DATA:  Right upper abdominal pain, diarrhea   EXAM: CT ABDOMEN AND PELVIS WITH CONTRAST   TECHNIQUE: Multidetector CT imaging of the abdomen and pelvis was performed using the standard protocol following bolus administration of intravenous contrast.   RADIATION DOSE REDUCTION: This exam was performed according to the departmental dose-optimization program which includes automated exposure control, adjustment of the mA and/or kV according to patient size and/or use of iterative reconstruction technique.   CONTRAST:  OMNIPAQUE IOHEXOL 300 MG/ML  SOLN   COMPARISON:  Outside hospital [facility]) CT abdomen/pelvis  dated 04/16/2020  FINDINGS: Lower chest: Lung bases are clear.   Hepatobiliary: Liver is within normal limits.   Status post cholecystectomy. No intrahepatic or extrahepatic ductal dilatation.   Pancreas: Within normal limits.   Spleen: Within normal limits.   Adrenals/Urinary Tract: Adrenal glands are within normal limits.   Kidneys are within normal limits. No hydronephrosis.   Bladder is within normal limits.   Stomach/Bowel: Stomach is within normal limits.   No evidence of bowel obstruction.   Normal appendix (series 2/image 51).   No colonic wall thickening or inflammatory changes.   Vascular/Lymphatic: Aneurysm   No suspicious abdominopelvic lymphadenopathy.   Reproductive: Uterus is within normal limits.   Bilateral ovaries are within normal limits.   Other: No abdominopelvic ascites.   Musculoskeletal: Visualized osseous structures are within normal limits.   IMPRESSION: Negative CT abdomen/pelvis.   Prior cholecystectomy.     Electronically Signed   By: Charline Bills M.D.   On: 05/14/2021 23:35     Assessment: Encounter Diagnoses  Name Primary?   RLQ abdominal pain Yes   Chronic diarrhea    Nausea in adult    Weight loss     Years of multiple chronic digestive symptoms to abdominal pain, bloating and nausea, all worse in recent months and now with associated weight loss. LFTs normal, no prior biliary ductal dilatation to suggest choledocholithiasis.  Also, the frequent diarrhea with postprandial urgency would not be typical for biliary cause.  While she may have some bile acid diarrhea, other possibilities such as celiac sprue, IBD and IBS should be considered.  The escalating symptoms with food avoidance and weight loss are particularly worrisome. I recommended she undergo endoscopic testing with EGD and colonoscopy, at which time we will take extensive biopsies.  She was agreeable after discussion of procedure and risks.  Since she  had not filled her prescriptions for and taken the meds noted above after the primary care visit in February, I have prescribed her a trial of hyoscyamine 2-3 times a day as needed.  Thank you for the courtesy of this consult.  Please call me with any questions or concerns.  Charlie Pitter III  CC: Referring provider noted above

## 2022-05-20 ENCOUNTER — Encounter: Payer: Self-pay | Admitting: Family Medicine

## 2022-05-27 ENCOUNTER — Emergency Department (HOSPITAL_COMMUNITY)
Admission: EM | Admit: 2022-05-27 | Discharge: 2022-05-28 | Disposition: A | Discharge status: Home / Self Care | Payer: BC Managed Care – PPO | Attending: Student | Admitting: Student

## 2022-05-27 ENCOUNTER — Other Ambulatory Visit: Payer: Self-pay

## 2022-05-27 ENCOUNTER — Emergency Department (HOSPITAL_COMMUNITY): Payer: BC Managed Care – PPO

## 2022-05-27 DIAGNOSIS — R109 Unspecified abdominal pain: Secondary | ICD-10-CM | POA: Diagnosis present

## 2022-05-27 DIAGNOSIS — N12 Tubulo-interstitial nephritis, not specified as acute or chronic: Secondary | ICD-10-CM | POA: Insufficient documentation

## 2022-05-27 DIAGNOSIS — E876 Hypokalemia: Secondary | ICD-10-CM | POA: Insufficient documentation

## 2022-05-27 LAB — COMPREHENSIVE METABOLIC PANEL
ALT: 20 U/L (ref 0–44)
AST: 20 U/L (ref 15–41)
Albumin: 4.5 g/dL (ref 3.5–5.0)
Alkaline Phosphatase: 51 U/L (ref 38–126)
Anion gap: 10 (ref 5–15)
BUN: 12 mg/dL (ref 6–20)
CO2: 23 mmol/L (ref 22–32)
Calcium: 9.1 mg/dL (ref 8.9–10.3)
Chloride: 105 mmol/L (ref 98–111)
Creatinine, Ser: 0.69 mg/dL (ref 0.44–1.00)
GFR, Estimated: >60 mL/min (ref >60)
Glucose, Bld: 83 mg/dL (ref 70–99)
Potassium: 3.4 mmol/L — ABNORMAL LOW (ref 3.5–5.1)
Sodium: 138 mmol/L (ref 135–145)
Total Bilirubin: 1.2 mg/dL (ref 0.3–1.2)
Total Protein: 7.7 g/dL (ref 6.5–8.1)

## 2022-05-27 LAB — URINALYSIS, ROUTINE W REFLEX MICROSCOPIC
Bilirubin Urine: NEGATIVE
Glucose, UA: NEGATIVE mg/dL
Hgb urine dipstick: NEGATIVE
Ketones, ur: 5 mg/dL — AB
Nitrite: POSITIVE — AB
Protein, ur: NEGATIVE mg/dL
Specific Gravity, Urine: 1.021 (ref 1.005–1.030)
pH: 5 (ref 5.0–8.0)

## 2022-05-27 LAB — HCG, QUANTITATIVE, PREGNANCY: hCG, Beta Chain, Quant, S: <1 m[IU]/mL (ref <5)

## 2022-05-27 LAB — CBC WITH DIFFERENTIAL/PLATELET
Abs Immature Granulocytes: 0.01 10*3/uL (ref 0.00–0.07)
Basophils Absolute: 0.1 10*3/uL (ref 0.0–0.1)
Basophils Relative: 1 %
Eosinophils Absolute: 0.1 10*3/uL (ref 0.0–0.5)
Eosinophils Relative: 1 %
HCT: 42.4 % (ref 36.0–46.0)
Hemoglobin: 14.2 g/dL (ref 12.0–15.0)
Immature Granulocytes: 0 %
Lymphocytes Relative: 40 %
Lymphs Abs: 2 10*3/uL (ref 0.7–4.0)
MCH: 29.5 pg (ref 26.0–34.0)
MCHC: 33.5 g/dL (ref 30.0–36.0)
MCV: 88.1 fL (ref 80.0–100.0)
Monocytes Absolute: 0.3 10*3/uL (ref 0.1–1.0)
Monocytes Relative: 6 %
Neutro Abs: 2.6 10*3/uL (ref 1.7–7.7)
Neutrophils Relative %: 52 %
Platelets: 130 10*3/uL — ABNORMAL LOW (ref 150–400)
RBC: 4.81 MIL/uL (ref 3.87–5.11)
RDW: 13 % (ref 11.5–15.5)
WBC: 4.9 10*3/uL (ref 4.0–10.5)
nRBC: 0 % (ref 0.0–0.2)

## 2022-05-27 LAB — LIPASE, BLOOD: Lipase: 30 U/L (ref 11–51)

## 2022-05-27 MED ORDER — SODIUM CHLORIDE 0.9 % IV SOLN
2.0000 g | Freq: Once | INTRAVENOUS | Status: AC
  Administered 2022-05-27: 2 g via INTRAVENOUS
  Filled 2022-05-27: qty 20

## 2022-05-27 MED ORDER — KETOROLAC TROMETHAMINE 15 MG/ML IJ SOLN
15.0000 mg | Freq: Once | INTRAMUSCULAR | Status: AC
  Administered 2022-05-27: 15 mg via INTRAVENOUS
  Filled 2022-05-27: qty 1

## 2022-05-27 MED ORDER — NAPROXEN 375 MG PO TABS: 375.0000 mg | ORAL_TABLET | Freq: Two times a day (BID) | ORAL | 0 refills | Status: DC

## 2022-05-27 MED ORDER — ONDANSETRON 4 MG PO TBDP: 4.0000 mg | ORAL_TABLET | Freq: Three times a day (TID) | ORAL | 0 refills | Status: DC | PRN

## 2022-05-27 MED ORDER — ONDANSETRON HCL 4 MG/2ML IJ SOLN
4.0000 mg | Freq: Once | INTRAMUSCULAR | Status: AC
  Administered 2022-05-27: 4 mg via INTRAVENOUS
  Filled 2022-05-27: qty 2

## 2022-05-27 MED ORDER — MORPHINE SULFATE (PF) 4 MG/ML IV SOLN
4.0000 mg | Freq: Once | INTRAVENOUS | Status: AC
  Administered 2022-05-27: 4 mg via INTRAVENOUS
  Filled 2022-05-27: qty 1

## 2022-05-27 MED ORDER — IOHEXOL 300 MG/ML  SOLN
100.0000 mL | Freq: Once | INTRAMUSCULAR | Status: AC | PRN
  Administered 2022-05-27: 100 mL via INTRAVENOUS

## 2022-05-27 MED ORDER — CEFADROXIL 500 MG PO CAPS: 500.0000 mg | ORAL_CAPSULE | Freq: Two times a day (BID) | ORAL | 0 refills | Status: AC

## 2022-05-27 MED ORDER — LACTATED RINGERS IV BOLUS
1000.0000 mL | Freq: Once | INTRAVENOUS | Status: AC
  Administered 2022-05-27: 1000 mL via INTRAVENOUS

## 2022-05-27 NOTE — ED Triage Notes (Signed)
Pt arrives c/o abdominal pain, nausea and decreased appetite x24 hours. States pain is to the R of her umbilicus. Also c/o constipation - last BM yesterday. Took excedrine this AM without relief.

## 2022-05-27 NOTE — ED Provider Notes (Signed)
[place] EMERGENCY DEPARTMENT AT [facility] Provider Note  CSN: [id number] Arrival date & time: 05/27/22 2129  Chief Complaint(s) Abdominal Pain  HPI Sally Haas is a 27 y.o. female with PMH cholecystitis status postcholecystectomy, bipolar disease, ectopic pregnancy status post tubal ligation, thrombocytopenia who presents emergency room for evaluation of abdominal pain.  Patient states that she has had a dull crampy abdominal pain in the right lower quadrant that radiates to the right flank worse over the last 24 hours.  Endorses nausea but denies chest pain, shortness of breath, headache, fever or other systemic symptoms.  Denies vaginal bleeding or dysuria.   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 Authorizing Provider  acetaminophen (TYLENOL) 500 MG tablet Take by mouth.    [provider]  aspirin-acetaminophen-caffeine (EXCEDRIN MIGRAINE) [phone] MG tablet Take by mouth every 6 (six) hours as needed for headache.    [provider]  hyoscyamine (LEVSIN) 0.125 MG tablet One every 6-8 hours as needed Patient not taking: Reported on 05/14/2022 05/14/22   Sherrilyn Rist, MD  Multiple Vitamin (MULTIVITAMIN) tablet Take 1 tablet by mouth daily. Patient not taking: Reported on 05/14/2022    [provider]                                                                                                                                     Past Surgical History Past Surgical History:  Procedure Laterality Date   DIAGNOSTIC LAPAROSCOPY WITH REMOVAL OF ECTOPIC PREGNANCY Right 05/07/2019   Procedure: Laparoscopic Removal Of Ectopic Pregnancy;  Surgeon: Reva Bores, MD;  Location: [facility] OR;  Service: Gynecology;  Laterality: Right;   LAPAROSCOPIC CHOLECYSTECTOMY     TUBAL LIGATION  03/2020   Family History Family History  Problem Relation Age of Onset   Cancer Mother    Alcohol abuse Father    Diabetes Paternal Aunt    Cancer Maternal Grandmother    Cancer Paternal Grandmother    Cancer Paternal Grandfather     Social History Social History   Tobacco Use   Smoking status: Never   Smokeless tobacco: Never  Vaping Use   Vaping Use: Never used  Substance Use Topics   Alcohol use: Not Currently   Drug use: Never   Allergies Patient has no known allergies.  Review of Systems Review of Systems  Gastrointestinal:  Positive for abdominal pain, nausea and vomiting.    Physical Exam Vital Signs  I have reviewed the triage vital signs BP (!) 136/92   Pulse 75   Temp 97.8 F (36.6 C)   Resp 16   Ht 5\' 5"  (1.651 m)   Wt 81.6 kg   LMP 05/18/2022 (Approximate)   SpO2 100%   BMI 29.95 kg/m   Physical Exam Vitals and nursing note reviewed.  Constitutional:      General: She is not in acute distress.    Appearance: She is well-developed.  HENT:     Head: Normocephalic and atraumatic.  Eyes:     Conjunctiva/sclera: Conjunctivae normal.  Cardiovascular:     Rate and Rhythm: Normal rate and regular rhythm.     Heart sounds: No murmur heard. Pulmonary:     Effort: Pulmonary effort is normal. No respiratory distress.     Breath sounds: Normal breath sounds.  Abdominal:     Palpations: Abdomen is soft.     Tenderness: There is abdominal tenderness in the right lower quadrant. There is right CVA tenderness.   Musculoskeletal:        General: No swelling.     Cervical back: Neck supple.  Skin:    General: Skin is warm and dry.     Capillary Refill: Capillary refill takes less than 2 seconds.  Neurological:     Mental Status: She is alert.  Psychiatric:        Mood and Affect: Mood normal.     ED Results and Treatments Labs (all labs ordered are listed, but only abnormal results are displayed) Labs Reviewed  CBC WITH DIFFERENTIAL/PLATELET  COMPREHENSIVE METABOLIC PANEL  LIPASE, BLOOD  URINALYSIS, ROUTINE W REFLEX MICROSCOPIC  HCG, QUANTITATIVE, PREGNANCY                                                                                                                          Radiology No results found.  Pertinent labs & imaging results that were available during my care of the patient were reviewed by me and considered in my medical decision making (see MDM for details).  Medications Ordered in ED Medications  lactated ringers bolus 1,000 mL (has no administration in time range)  ondansetron (ZOFRAN) injection 4 mg (has no administration in time range)  ketorolac (TORADOL) 15 MG/ML injection 15 mg (has no administration in time range)  Procedures Procedures  (including critical care time)  Medical Decision Making / ED Course   This patient presents to the ED for concern of right lower quadrant abdominal pain, this involves an extensive number of treatment options, and is a complaint that carries with it a high risk of complications and morbidity.  The differential diagnosis includes appendicitis, nephrolithiasis, diverticulitis, epiploic appendagitis, inflammatory bowel disease, constipation, gastroenteritis,  MDM: Patient seen in the emergency department for evaluation of abdominal pain.  Physical exam with right lower quadrant tenderness to  palpation and right CVA tenderness.  Laboratory evaluation with a mild hypokalemia to 3.4 but is otherwise unremarkable.  Urinalysis with positive nitrites, large leuk esterase, 21-50 white blood cells and many bacteria.  CT abdomen pelvis unremarkable.  Urine culture sent and patient given ceftriaxone.  Patient presentation clinically consistent with UTI and developing pyelonephritis.  Patient will be discharged on 10 days of Duricef with pain and nausea control.  On reevaluation, patient symptomatically improved and remains hemodynamically stable.  She is safe for discharge with outpatient follow-up and return precautions of which she and her mother voiced understanding.  Patient been discharged   Additional history obtained: -Additional history obtained from mother -External records from outside source obtained and reviewed including: Chart review including previous notes, labs, imaging, consultation notes   Lab Tests: -I ordered, reviewed, and interpreted labs.   The pertinent results include:   Labs Reviewed  CBC WITH DIFFERENTIAL/PLATELET  COMPREHENSIVE METABOLIC PANEL  LIPASE, BLOOD  URINALYSIS, ROUTINE W REFLEX MICROSCOPIC  HCG, QUANTITATIVE, PREGNANCY      Imaging Studies ordered: I ordered imaging studies including CT abdomen pelvis I independently visualized and interpreted imaging. I agree with the radiologist interpretation   Medicines ordered and prescription drug management: Meds ordered this encounter  Medications   lactated ringers bolus 1,000 mL   ondansetron (ZOFRAN) injection 4 mg   ketorolac (TORADOL) 15 MG/ML injection 15 mg    -I have reviewed the patients home medicines and have made adjustments as needed  Critical interventions none    Cardiac Monitoring: The patient was maintained on a cardiac monitor.  I personally viewed and interpreted the cardiac monitored which showed an underlying rhythm of: NSR  Social Determinants of Health:  Factors  impacting patients care include: none   Reevaluation: After the interventions noted above, I reevaluated the patient and found that they have :improved  Co morbidities that complicate the patient evaluation  Past Medical History:  Diagnosis Date   Bipolar disease, chronic (HCC)    Ectopic pregnancy    Kidney stone    Thrombocytopenia (HCC)       Dispostion: I considered admission for this patient, but at this time she does not meet inpatient criteria for admission she is safe for discharge with outpatient follow-up     Final Clinical Impression(s) / ED Diagnoses Final diagnoses:  None     @PCDICTATION @    Nima Kemppainen, Wyn Forster, MD 05/28/22 [phone]

## 2022-05-29 ENCOUNTER — Emergency Department (HOSPITAL_COMMUNITY): Payer: BC Managed Care – PPO

## 2022-05-29 ENCOUNTER — Encounter (HOSPITAL_COMMUNITY): Payer: Self-pay

## 2022-05-29 ENCOUNTER — Other Ambulatory Visit: Payer: Self-pay

## 2022-05-29 ENCOUNTER — Emergency Department (HOSPITAL_COMMUNITY)
Admission: EM | Admit: 2022-05-29 | Discharge: 2022-05-29 | Disposition: A | Discharge status: Home / Self Care | Payer: BC Managed Care – PPO | Attending: Student | Admitting: Student

## 2022-05-29 DIAGNOSIS — R1013 Epigastric pain: Secondary | ICD-10-CM | POA: Insufficient documentation

## 2022-05-29 LAB — CBC WITH DIFFERENTIAL/PLATELET
Abs Immature Granulocytes: 0 10*3/uL (ref 0.00–0.07)
Basophils Absolute: 0 10*3/uL (ref 0.0–0.1)
Basophils Relative: 1 %
Eosinophils Absolute: 0 10*3/uL (ref 0.0–0.5)
Eosinophils Relative: 1 %
HCT: 41 % (ref 36.0–46.0)
Hemoglobin: 13.4 g/dL (ref 12.0–15.0)
Immature Granulocytes: 0 %
Lymphocytes Relative: 29 %
Lymphs Abs: 1.2 10*3/uL (ref 0.7–4.0)
MCH: 29.1 pg (ref 26.0–34.0)
MCHC: 32.7 g/dL (ref 30.0–36.0)
MCV: 88.9 fL (ref 80.0–100.0)
Monocytes Absolute: 0.3 10*3/uL (ref 0.1–1.0)
Monocytes Relative: 7 %
Neutro Abs: 2.6 10*3/uL (ref 1.7–7.7)
Neutrophils Relative %: 62 %
Platelets: 122 10*3/uL — ABNORMAL LOW (ref 150–400)
RBC: 4.61 MIL/uL (ref 3.87–5.11)
RDW: 12.8 % (ref 11.5–15.5)
WBC: 4.1 10*3/uL (ref 4.0–10.5)
nRBC: 0 % (ref 0.0–0.2)

## 2022-05-29 LAB — COMPREHENSIVE METABOLIC PANEL
ALT: 16 U/L (ref 0–44)
AST: 16 U/L (ref 15–41)
Albumin: 4.3 g/dL (ref 3.5–5.0)
Alkaline Phosphatase: 44 U/L (ref 38–126)
Anion gap: 8 (ref 5–15)
BUN: 9 mg/dL (ref 6–20)
CO2: 24 mmol/L (ref 22–32)
Calcium: 8.7 mg/dL — ABNORMAL LOW (ref 8.9–10.3)
Chloride: 107 mmol/L (ref 98–111)
Creatinine, Ser: 0.68 mg/dL (ref 0.44–1.00)
GFR, Estimated: >60 mL/min (ref >60)
Glucose, Bld: 101 mg/dL — ABNORMAL HIGH (ref 70–99)
Potassium: 3.4 mmol/L — ABNORMAL LOW (ref 3.5–5.1)
Sodium: 139 mmol/L (ref 135–145)
Total Bilirubin: 0.6 mg/dL (ref 0.3–1.2)
Total Protein: 7.1 g/dL (ref 6.5–8.1)

## 2022-05-29 LAB — URINALYSIS, ROUTINE W REFLEX MICROSCOPIC
Bilirubin Urine: NEGATIVE
Glucose, UA: NEGATIVE mg/dL
Hgb urine dipstick: NEGATIVE
Ketones, ur: NEGATIVE mg/dL
Nitrite: NEGATIVE
Protein, ur: NEGATIVE mg/dL
Specific Gravity, Urine: 1.018 (ref 1.005–1.030)
pH: 7 (ref 5.0–8.0)

## 2022-05-29 LAB — LIPASE, BLOOD: Lipase: 33 U/L (ref 11–51)

## 2022-05-29 MED ORDER — LIDOCAINE VISCOUS HCL 2 % MT SOLN
15.0000 mL | Freq: Once | OROMUCOSAL | Status: AC
  Administered 2022-05-29: 15 mL via ORAL
  Filled 2022-05-29: qty 15

## 2022-05-29 MED ORDER — LORAZEPAM 2 MG/ML IJ SOLN
0.5000 mg | Freq: Once | INTRAMUSCULAR | Status: AC
  Administered 2022-05-29: 0.5 mg via INTRAVENOUS
  Filled 2022-05-29: qty 1

## 2022-05-29 MED ORDER — DROPERIDOL 2.5 MG/ML IJ SOLN
1.2500 mg | Freq: Once | INTRAMUSCULAR | Status: AC
  Administered 2022-05-29: 1.25 mg via INTRAVENOUS
  Filled 2022-05-29: qty 2

## 2022-05-29 MED ORDER — MAALOX MAX 400-400-40 MG/5ML PO SUSP: 15.0000 mL | Freq: Four times a day (QID) | ORAL | 0 refills | Status: DC | PRN

## 2022-05-29 MED ORDER — ALUM & MAG HYDROXIDE-SIMETH 200-200-20 MG/5ML PO SUSP
30.0000 mL | Freq: Once | ORAL | Status: AC
  Administered 2022-05-29: 30 mL via ORAL
  Filled 2022-05-29: qty 30

## 2022-05-29 MED ORDER — FAMOTIDINE IN NACL 20-0.9 MG/50ML-% IV SOLN
20.0000 mg | Freq: Once | INTRAVENOUS | Status: AC
  Administered 2022-05-29: 20 mg via INTRAVENOUS
  Filled 2022-05-29: qty 50

## 2022-05-29 NOTE — ED Notes (Signed)
Pt given water for PO challenge 

## 2022-05-29 NOTE — ED Triage Notes (Signed)
C/o generalized abd pain with nausea x 12 hours. Tender with palpitation. Pt reports seeing GI for chronic diarrhea but has been constipated lately  LBM: 3 days ago.  Pt treated on 5/9 with 10 days of Duricef.  Hx cholecystectomy.

## 2022-05-29 NOTE — ED Provider Notes (Signed)
[place] EMERGENCY DEPARTMENT AT [facility] Provider Note  CSN: [id number] Arrival date & time: 05/29/22 1740  Chief Complaint(s) Abdominal Pain  HPI Sally Haas is a 27 y.o. female with PMH cholecystitis s/p cholecystectomy, bipolar disease, ectopic pregnancy status post tubal ligation, thrombocytopenia who presents emergency department for evaluation of abdominal pain nausea and vomiting.  I personally saw this patient on 05/27/2022 and she was diagnosed with pyelonephritis and is currently on Duricef.  She states that the pain that she presented for on 05/27/2022 has resolved but today she had a sudden onset epigastric pain that was sharp and burning with associated nausea and she is having difficulty tolerating p.o.  She denies vaginal bleeding, vaginal discharge, chest pain, shortness of breath, headache, fever or other systemic symptoms.   Past Medical History Past Medical History:  Diagnosis Date   Bipolar disease, chronic (HCC)    Ectopic pregnancy    Kidney stone    Thrombocytopenia 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 Authorizing Provider  aspirin-acetaminophen-caffeine (EXCEDRIN MIGRAINE) [phone] MG tablet Take by mouth every 6 (six) hours as needed for headache.   Yes [provider]  cefadroxil (DURICEF) 500 MG capsule Take 1 capsule (500 mg total) by mouth 2 (two) times daily for 10 days. 05/27/22 06/06/22 Yes Tyshia Fenter, MD  naproxen (NAPROSYN) 375 MG tablet Take 1 tablet (375 mg total) by mouth 2  (two) times daily. Patient taking differently: Take 375 mg by mouth 2 (two) times daily as needed for mild pain. 05/27/22  Yes Aaima Gaddie, MD  ondansetron (ZOFRAN-ODT) 4 MG disintegrating tablet Take 1 tablet (4 mg total) by mouth every 8 (eight) hours as needed for nausea or vomiting. 05/27/22  Yes Bharat Antillon, MD  hyoscyamine (LEVSIN) 0.125 MG tablet One every 6-8 hours as needed Patient not taking: Reported on 05/14/2022 05/14/22   Sherrilyn Rist, MD                                                                                                                                    Past Surgical History Past Surgical History:  Procedure Laterality Date   DIAGNOSTIC LAPAROSCOPY WITH REMOVAL OF ECTOPIC PREGNANCY Right 05/07/2019   Procedure: Laparoscopic Removal Of Ectopic Pregnancy;  Surgeon: Reva Bores, MD;  Location: [facility] OR;  Service: Gynecology;  Laterality: Right;   LAPAROSCOPIC CHOLECYSTECTOMY     TUBAL LIGATION  03/2020   Family History Family History  Problem Relation Age of Onset  Cancer Mother    Alcohol abuse Father    Diabetes Paternal Aunt    Cancer Maternal Grandmother    Cancer Paternal Grandmother    Cancer Paternal Grandfather     Social History Social History   Tobacco Use   Smoking status: Never   Smokeless tobacco: Never  Vaping Use   Vaping Use: Never used  Substance Use Topics   Alcohol use: Not Currently   Drug use: Never   Allergies Patient has no known allergies.  Review of Systems Review of Systems  Gastrointestinal:  Positive for abdominal pain, nausea and vomiting.    Physical Exam Vital Signs  I have reviewed the triage vital signs BP 128/82   Pulse 65   Temp 98.2 F (36.8 C)   Resp 18   LMP 05/18/2022 (Approximate)   SpO2 97%   Physical Exam Vitals and nursing note reviewed.  Constitutional:      General: She is not in acute distress.    Appearance: She is well-developed.  HENT:     Head: Normocephalic and  atraumatic.  Eyes:     Conjunctiva/sclera: Conjunctivae normal.  Cardiovascular:     Rate and Rhythm: Normal rate and regular rhythm.     Heart sounds: No murmur heard. Pulmonary:     Effort: Pulmonary effort is normal. No respiratory distress.     Breath sounds: Normal breath sounds.  Abdominal:     Palpations: Abdomen is soft.     Tenderness: There is abdominal tenderness in the epigastric area.  Musculoskeletal:        General: No swelling.     Cervical back: Neck supple.  Skin:    General: Skin is warm and dry.     Capillary Refill: Capillary refill takes less than 2 seconds.  Neurological:     Mental Status: She is alert.  Psychiatric:        Mood and Affect: Mood normal.     ED Results and Treatments Labs (all labs ordered are listed, but only abnormal results are displayed) Labs Reviewed  CBC WITH DIFFERENTIAL/PLATELET - Abnormal; Notable for the following components:      Result Value   Platelets 122 (*)    All other components within normal limits  COMPREHENSIVE METABOLIC PANEL - Abnormal; Notable for the following components:   Potassium 3.4 (*)    Glucose, Bld 101 (*)    Calcium 8.7 (*)    All other components within normal limits  URINALYSIS, ROUTINE W REFLEX MICROSCOPIC - Abnormal; Notable for the following components:   APPearance CLOUDY (*)    Leukocytes,Ua MODERATE (*)    Bacteria, UA RARE (*)    All other components within normal limits  LIPASE, BLOOD                                                                                                                          Radiology DG Abdomen 1 View  Result Date: 05/29/2022 CLINICAL DATA:  r/o  obstruction EXAM: ABDOMEN - 1 VIEW COMPARISON:  None Available. FINDINGS: The bowel gas pattern is normal. No radio-opaque calculi or other significant radiographic abnormality are seen. Right upper quadrant surgical clips. IMPRESSION: Negative. Electronically Signed   By: Tish Frederickson M.D.   On: 05/29/2022  19:41    Pertinent labs & imaging results that were available during my care of the patient were reviewed by me and considered in my medical decision making (see MDM for details).  Medications Ordered in ED Medications  alum & mag hydroxide-simeth (MAALOX/MYLANTA) 200-200-20 MG/5ML suspension 30 mL (30 mLs Oral Given 05/29/22 1822)    And  lidocaine (XYLOCAINE) 2 % viscous mouth solution 15 mL (15 mLs Oral Given 05/29/22 1821)  famotidine (PEPCID) IVPB 20 mg premix (0 mg Intravenous Stopped 05/29/22 1950)  droperidol (INAPSINE) 2.5 MG/ML injection 1.25 mg (1.25 mg Intravenous Given 05/29/22 2040)  LORazepam (ATIVAN) injection 0.5 mg (0.5 mg Intravenous Given 05/29/22 2041)                                                                                                                                     Procedures Procedures  (including critical care time)  Medical Decision Making / ED Course   This patient presents to the ED for concern of epigastric abdominal pain, this involves an extensive number of treatment options, and is a complaint that carries with it a high risk of complications and morbidity.  The differential diagnosis includes GERD/gastritis, peptic ulcer disease, pancreatitis, gastroparesis, pneumonia, pleurisy, pericarditis  MDM: Patient seen emergency room for evaluation of epigastric abdominal pain.  Physical exam with mild tenderness in the epigastrium but is otherwise unremarkable.  Evaluation with a mild hypokalemia to 3.4 but is otherwise unremarkable.  Urinalysis with moderate leuk esterase, 6-10 white blood cells, rare bacteria but the sample is dirty with 11-20 squamous cells.  She is already on antibiotics for a UTI diagnosed 2 days ago.  Lipase unremarkable.  KUB without significant stool burden or obstruction.  Patient received a CAT scan 2 days ago that was reassuringly negative and she is hemodynamically stable with normal lab workup, I have lower suspicion for acute  intra-abdominal pathology today and we will defer additional CT imaging.  Patient received a GI cocktail and symptoms improved but still mildly persistent and she then received droperidol and Ativan.  On reevaluation, her symptoms have resolved and she is able to tolerate p.o. without difficulty.  At this time, she does not meet inpatient criteria for admission she is safe for discharge with outpatient follow-up.   Additional history obtained: -Additional history obtained from husband -External records from outside source obtained and reviewed including: Chart review including previous notes, labs, imaging, consultation notes   Lab Tests: -I ordered, reviewed, and interpreted labs.   The pertinent results include:   Labs Reviewed  CBC WITH DIFFERENTIAL/PLATELET - Abnormal; Notable for the following components:  Result Value   Platelets 122 (*)    All other components within normal limits  COMPREHENSIVE METABOLIC PANEL - Abnormal; Notable for the following components:   Potassium 3.4 (*)    Glucose, Bld 101 (*)    Calcium 8.7 (*)    All other components within normal limits  URINALYSIS, ROUTINE W REFLEX MICROSCOPIC - Abnormal; Notable for the following components:   APPearance CLOUDY (*)    Leukocytes,Ua MODERATE (*)    Bacteria, UA RARE (*)    All other components within normal limits  LIPASE, BLOOD       Imaging Studies ordered: I ordered imaging studies including KUB I independently visualized and interpreted imaging. I agree with the radiologist interpretation   Medicines ordered and prescription drug management: Meds ordered this encounter  Medications   AND Linked Order Group    alum & mag hydroxide-simeth (MAALOX/MYLANTA) 200-200-20 MG/5ML suspension 30 mL    lidocaine (XYLOCAINE) 2 % viscous mouth solution 15 mL   famotidine (PEPCID) IVPB 20 mg premix   droperidol (INAPSINE) 2.5 MG/ML injection 1.25 mg   LORazepam (ATIVAN) injection 0.5 mg    -I have reviewed  the patients home medicines and have made adjustments as needed  Critical interventions none  Cardiac Monitoring: The patient was maintained on a cardiac monitor.  I personally viewed and interpreted the cardiac monitored which showed an underlying rhythm of: NSR  Social Determinants of Health:  Factors impacting patients care include: none   Reevaluation: After the interventions noted above, I reevaluated the patient and found that they have :improved  Co morbidities that complicate the patient evaluation  Past Medical History:  Diagnosis Date   Bipolar disease, chronic (HCC)    Ectopic pregnancy    Kidney stone    Thrombocytopenia (HCC)       Dispostion: I considered admission for this patient, but at this time she does not meet inpatient criteria for admission she is safe for discharge with outpatient follow-up     Final Clinical Impression(s) / ED Diagnoses Final diagnoses:  None     @PCDICTATION @    Glendora Score, MD 05/29/22 2245

## 2022-05-30 ENCOUNTER — Ambulatory Visit
Admission: RE | Admit: 2022-05-30 | Discharge: 2022-05-30 | Disposition: A | Discharge status: Home / Self Care | Payer: BC Managed Care – PPO | Source: Ambulatory Visit | Attending: Family Medicine | Admitting: Family Medicine

## 2022-05-30 DIAGNOSIS — G44229 Chronic tension-type headache, not intractable: Secondary | ICD-10-CM

## 2022-05-30 DIAGNOSIS — R519 Headache, unspecified: Secondary | ICD-10-CM

## 2022-05-30 LAB — URINE CULTURE

## 2022-05-31 ENCOUNTER — Encounter: Payer: Self-pay | Admitting: Gastroenterology

## 2022-05-31 ENCOUNTER — Telehealth (HOSPITAL_BASED_OUTPATIENT_CLINIC_OR_DEPARTMENT_OTHER): Payer: Self-pay | Admitting: *Deleted

## 2022-05-31 NOTE — Telephone Encounter (Signed)
Post ED Visit - Positive Culture Follow-up  Culture report reviewed by antimicrobial stewardship pharmacist: [facility] []  Enzo Bi, Pharm.D. []  Celedonio Miyamoto, Pharm.D., BCPS AQ-ID []  Garvin Fila, Pharm.D., BCPS []  Georgina Pillion, Pharm.D., BCPS []  [place], [address].D., BCPS, AAHIVP []  Estella Husk, Pharm.D., BCPS, AAHIVP []  Lysle Pearl, PharmD, BCPS []  Phillips Climes, PharmD, BCPS []  Agapito Games, PharmD, BCPS []  Verlan Friends, PharmD []  Mervyn Gay, PharmD, BCPS []  Vinnie Level, PharmD  [facility] []  Len Childs, PharmD []  Greer Pickerel, PharmD []  Adalberto Cole, PharmD []  Perlie Gold, Rph []  Lonell Face) Jean Rosenthal, PharmD []  Earl Many, PharmD []  Junita Push, PharmD []  Dorna Leitz, PharmD []  Terrilee Files, PharmD []  Lynann Beaver, PharmD []  Keturah Barre, PharmD []  Loralee Pacas, PharmD [x]  Georgina Pillion, PharmD   Positive urine culture Treated with Cefadroxil, organism sensitive to the same and no further patient follow-up is required at this time.  Sally Haas 05/31/2022, 9:18 AM

## 2022-06-03 ENCOUNTER — Ambulatory Visit
Admission: RE | Admit: 2022-06-03 | Discharge: 2022-06-03 | Disposition: A | Discharge status: Home / Self Care | Payer: BC Managed Care – PPO | Source: Ambulatory Visit | Attending: Family Medicine | Admitting: Family Medicine

## 2022-06-03 DIAGNOSIS — R221 Localized swelling, mass and lump, neck: Secondary | ICD-10-CM

## 2022-06-03 MED ORDER — IOPAMIDOL (ISOVUE-300) INJECTION 61%
75.0000 mL | Freq: Once | INTRAVENOUS | Status: AC | PRN
  Administered 2022-06-03: 75 mL via INTRAVENOUS

## 2022-06-09 ENCOUNTER — Ambulatory Visit (AMBULATORY_SURGERY_CENTER): Payer: BC Managed Care – PPO | Admitting: Gastroenterology

## 2022-06-09 ENCOUNTER — Encounter: Payer: Self-pay | Admitting: Gastroenterology

## 2022-06-09 VITALS — BP 103/63 | HR 66 | Temp 98.9°F | Resp 14 | Ht 65.0 in | Wt 190.0 lb

## 2022-06-09 DIAGNOSIS — R634 Abnormal weight loss: Secondary | ICD-10-CM

## 2022-06-09 DIAGNOSIS — R1031 Right lower quadrant pain: Secondary | ICD-10-CM | POA: Diagnosis not present

## 2022-06-09 DIAGNOSIS — K529 Noninfective gastroenteritis and colitis, unspecified: Secondary | ICD-10-CM

## 2022-06-09 DIAGNOSIS — R140 Abdominal distension (gaseous): Secondary | ICD-10-CM | POA: Diagnosis not present

## 2022-06-09 DIAGNOSIS — R110 Nausea: Secondary | ICD-10-CM

## 2022-06-09 MED ORDER — SODIUM CHLORIDE 0.9 % IV SOLN
500.0000 mL | INTRAVENOUS | Status: DC
Start: 2022-06-09 — End: 2022-06-09

## 2022-06-09 NOTE — Op Note (Signed)
[facility] Patient Name: Sally Haas Procedure Date: 06/09/2022 3:27 PM MRN: [id number] Endoscopist: Sherilyn Cooter L. Myrtie Neither , MD, [id number] Age: 26 Referring MD:  Date of Birth: 05/31/1995 Gender: Female Account #: [id number] Procedure:                Upper GI endoscopy Indications:              Abdominal bloating, Diarrhea, Weight loss Medicines:                Monitored Anesthesia Care Procedure:                Pre-Anesthesia Assessment:                           - Prior to the procedure, a History and Physical                            was performed, and patient medications and                            allergies were reviewed. The patient's tolerance of                            previous anesthesia was also reviewed. The risks                            and benefits of the procedure and the sedation                            options and risks were discussed with the patient.                            All questions were answered, and informed consent                            was obtained. Prior Anticoagulants: The patient has                            taken no anticoagulant or antiplatelet agents. ASA                            Grade Assessment: II - A patient with mild systemic                            disease. After reviewing the risks and benefits,                            the patient was deemed in satisfactory condition to                            undergo the procedure.                           After obtaining informed consent, the endoscope was  passed under direct vision. Throughout the                            procedure, the patient's blood pressure, pulse, and                            oxygen saturations were monitored continuously. The                            Olympus Scope [id number] was introduced through the                            mouth, and advanced to the second part of duodenum.                            The upper  GI endoscopy was accomplished without                            difficulty. The patient tolerated the procedure                            well. Scope In: Scope Out: Findings:                 The larynx was normal.                           The esophagus was normal.                           The stomach was normal.                           The cardia and gastric fundus were normal on                            retroflexion.                           Normal mucosa was found in the entire duodenum.                            Biopsies for histology were taken with a cold                            forceps for evaluation of celiac disease. Complications:            No immediate complications. Estimated Blood Loss:     Estimated blood loss was minimal. Impression:               - Normal larynx.                           - Normal esophagus.                           - Normal stomach.                           -  Normal mucosa was found in the entire examined                            duodenum. Biopsied. Recommendation:           - Patient has a contact number available for                            emergencies. The signs and symptoms of potential                            delayed complications were discussed with the                            patient. Return to normal activities tomorrow.                            Written discharge instructions were provided to the                            patient.                           - Resume previous diet.                           - Continue present medications.                           - Await pathology results.                           - See the other procedure note for documentation of                            additional recommendations. Dennison Mcdaid L. Myrtie Neither, MD 06/09/2022 3:59:23 PM This report has been signed electronically.

## 2022-06-09 NOTE — Progress Notes (Unsigned)
No changes to clinical history since GI office visit on 05/14/22.  The patient is appropriate for an endoscopic procedure in the ambulatory setting.  - Joud Pettinato Danis, MD    

## 2022-06-09 NOTE — Progress Notes (Unsigned)
Sedate, gd SR, tolerated procedure well, VSS, report to RN 

## 2022-06-09 NOTE — Patient Instructions (Signed)
Please read handouts provided. Continue present medications. Await pathology results. Consider trying the hyoscyamine prescribed at the recent office visit.  YOU HAD AN ENDOSCOPIC PROCEDURE TODAY AT THE [facility]:   Refer to the procedure report that was given to you for any specific questions about what was found during the examination.  If the procedure report does not answer your questions, please call your gastroenterologist to clarify.  If you requested that your care partner not be given the details of your procedure findings, then the procedure report has been included in a sealed envelope for you to review at your convenience later.  YOU SHOULD EXPECT: Some feelings of bloating in the abdomen. Passage of more gas than usual.  Walking can help get rid of the air that was put into your GI tract during the procedure and reduce the bloating. If you had a lower endoscopy (such as a colonoscopy or flexible sigmoidoscopy) you may notice spotting of blood in your stool or on the toilet paper. If you underwent a bowel prep for your procedure, you may not have a normal bowel movement for a few days.  Please Note:  You might notice some irritation and congestion in your nose or some drainage.  This is from the oxygen used during your procedure.  There is no need for concern and it should clear up in a day or so.  SYMPTOMS TO REPORT IMMEDIATELY:  Following lower endoscopy (colonoscopy or flexible sigmoidoscopy):  Excessive amounts of blood in the stool  Significant tenderness or worsening of abdominal pains  Swelling of the abdomen that is new, acute  Fever of 100F or higher  Following upper endoscopy (EGD)  Vomiting of blood or coffee ground material  New chest pain or pain under the shoulder blades  Painful or persistently difficult swallowing  New shortness of breath  Fever of 100F or higher  Black, tarry-looking stools  For urgent or emergent issues, a gastroenterologist  can be reached at any hour by calling [phone]. Do not use MyChart messaging for urgent concerns.    DIET:  We do recommend a small meal at first, but then you may proceed to your regular diet.  Drink plenty of fluids but you should avoid alcoholic beverages for 24 hours.  ACTIVITY:  You should plan to take it easy for the rest of today and you should NOT DRIVE or use heavy machinery until tomorrow (because of the sedation medicines used during the test).    FOLLOW UP: Our staff will call the number listed on your records the next business day following your procedure.  We will call around 7:15- 8:00 am to check on you and address any questions or concerns that you may have regarding the information given to you following your procedure. If we do not reach you, we will leave a message.     If any biopsies were taken you will be contacted by phone or by letter within the next 1-3 weeks.  Please call us at [phone] if you have not heard about the biopsies in 3 weeks.    SIGNATURES/CONFIDENTIALITY: You and/or your care partner have signed paperwork which will be entered into your electronic medical record.  These signatures attest to the fact that that the information above on your After Visit Summary has been reviewed and is understood.  Full responsibility of the confidentiality of this discharge information lies with you and/or your care-partner.

## 2022-06-09 NOTE — Progress Notes (Signed)
Called to room to assist during endoscopic procedure.  Patient ID and intended procedure confirmed with present staff. Received instructions for my participation in the procedure from the performing physician.  

## 2022-06-09 NOTE — Op Note (Signed)
[facility] Patient Name: Sally Haas Procedure Date: 06/09/2022 3:18 PM MRN: [id number] Endoscopist: Sherilyn Cooter L. Myrtie Neither , MD, [id number] Age: 27 Referring MD:  Date of Birth: 1995/02/14 Gender: Female Account #: [id number] Procedure:                Colonoscopy Indications:              Chronic diarrhea, Weight loss (food avoidance due                            to symptoms)                           clinical details in recent office consult note Medicines:                Monitored Anesthesia Care Procedure:                Pre-Anesthesia Assessment:                           - Prior to the procedure, a History and Physical                            was performed, and patient medications and                            allergies were reviewed. The patient's tolerance of                            previous anesthesia was also reviewed. The risks                            and benefits of the procedure and the sedation                            options and risks were discussed with the patient.                            All questions were answered, and informed consent                            was obtained. Prior Anticoagulants: The patient has                            taken no anticoagulant or antiplatelet agents. ASA                            Grade Assessment: II - A patient with mild systemic                            disease. After reviewing the risks and benefits,                            the patient was deemed in satisfactory condition to  undergo the procedure.                           After obtaining informed consent, the colonoscope                            was passed under direct vision. Throughout the                            procedure, the patient's blood pressure, pulse, and                            oxygen saturations were monitored continuously. The                            CF [id number] #[id number] was introduced through the  anus                            and advanced to the the terminal ileum, with                            identification of the appendiceal orifice and IC                            valve. The colonoscopy was performed without                            difficulty. The patient tolerated the procedure                            well. The quality of the bowel preparation was                            excellent. The terminal ileum, ileocecal valve,                            appendiceal orifice, and rectum were photographed. Scope In: 3:46:09 PM Scope Out: 3:55:48 PM Scope Withdrawal Time: 0 hours 7 minutes 1 second  Total Procedure Duration: 0 hours 9 minutes 39 seconds  Findings:                 The perianal and digital rectal examinations were                            normal.                           The terminal ileum appeared normal.                           Normal mucosa was found in the entire colon.                            Biopsies for histology were taken with a cold  forceps from the ascending colon, transverse colon                            and sigmoid colon for evaluation of microscopic                            colitis.                           The entire examined colon appeared normal on direct                            and retroflexion views. Complications:            No immediate complications. Estimated Blood Loss:     Estimated blood loss was minimal. Impression:               - The examined portion of the ileum was normal.                           - Normal mucosa in the entire examined colon.                            Biopsied.                           - The entire examined colon is normal on direct and                            retroflexion views. Recommendation:           - Patient has a contact number available for                            emergencies. The signs and symptoms of potential                            delayed  complications were discussed with the                            patient. Return to normal activities tomorrow.                            Written discharge instructions were provided to the                            patient.                           - Resume previous diet.                           - Continue present medications.                           - Await pathology results.                           -  No recommendation at this time regarding repeat                            colonoscopy due to young age.                           - See the other procedure note for documentation of                            additional recommendations.                           - Return to my office at appointment to be                            scheduled.                           - Consider trying the hyoscyamine medicine                            prescribed at the recent office visit. If not                            helpful or if side effects after about 2 weeks, we                            will try something different (most likely a                            bile-acid binding medicine). Marlin Brys L. Myrtie Neither, MD 06/09/2022 4:05:33 PM This report has been signed electronically.

## 2022-06-10 ENCOUNTER — Telehealth: Payer: Self-pay | Admitting: Nurse Practitioner

## 2022-06-10 ENCOUNTER — Telehealth: Payer: Self-pay | Admitting: *Deleted

## 2022-06-10 NOTE — Telephone Encounter (Signed)
  Follow up Call-     06/09/2022    2:55 PM  Call back number  Post procedure Call Back phone  # [phone]  Permission to leave phone message Yes     Patient questions:   Message left to call us if necessary.

## 2022-06-15 ENCOUNTER — Ambulatory Visit: Payer: BC Managed Care – PPO | Admitting: Family Medicine

## 2022-06-21 ENCOUNTER — Encounter: Payer: Self-pay | Admitting: Family Medicine

## 2022-06-21 ENCOUNTER — Ambulatory Visit (INDEPENDENT_AMBULATORY_CARE_PROVIDER_SITE_OTHER): Payer: BC Managed Care – PPO | Admitting: Family Medicine

## 2022-06-21 VITALS — BP 122/86 | HR 104 | Temp 98.2°F | Resp 16 | Ht 65.5 in | Wt 189.0 lb

## 2022-06-21 DIAGNOSIS — E876 Hypokalemia: Secondary | ICD-10-CM | POA: Diagnosis not present

## 2022-06-21 DIAGNOSIS — G44229 Chronic tension-type headache, not intractable: Secondary | ICD-10-CM | POA: Diagnosis not present

## 2022-06-21 DIAGNOSIS — R1084 Generalized abdominal pain: Secondary | ICD-10-CM

## 2022-06-21 NOTE — Patient Instructions (Signed)
It was very nice to see you today!  Keep up the great work!   PLEASE NOTE:  If you had any lab tests please let us know if you have not heard back within a few days. You may see your results on MyChart before we have a chance to review them but we will give you a call once they are reviewed by us. If we ordered any referrals today, please let us know if you have not heard from their office within the next week.   Please try these tips to maintain a healthy lifestyle:  Eat most of your calories during the day when you are active. Eliminate processed foods including packaged sweets (pies, cakes, cookies), reduce intake of potatoes, white bread, white pasta, and white rice. Look for whole grain options, oat flour or almond flour.  Each meal should contain half fruits/vegetables, one quarter protein, and one quarter carbs (no bigger than a computer mouse).  Cut down on sweet beverages. This includes juice, soda, and sweet tea. Also watch fruit intake, though this is a healthier sweet option, it still contains natural sugar! Limit to 3 servings daily.  Drink at least 1 glass of water with each meal and aim for at least 8 glasses per day  Exercise at least 150 minutes every week.   

## 2022-06-21 NOTE — Progress Notes (Signed)
Subjective:     Patient ID: Sally Haas, female    DOB: [birth year], 27 y.o.   MRN: [id number]  Chief Complaint  Patient presents with   Headache    1 month f/u All imaging negative Has cut back on caffeine and stress - much better this past month     HPI  Headache(s)-had MRI-normal.  Has cut back on caffeine and less stress so better. No artificial sweeteners.  Still getting milder headache(s) but ok w/it.  Stress incont-will see gynecology.  Had colonoscopy 5/22.  Will eat and have to run to bathroom-"pile of mud".   Seeing GI  Health Maintenance Due  Topic Date Due   PAP-Cervical Cytology Screening  07/24/2022   PAP SMEAR-Modifier  07/24/2022    Past Medical History:  Diagnosis Date   Bipolar disease, chronic (HCC)    Ectopic pregnancy    Kidney stone    Thrombocytopenia [facility])     Past Surgical History:  Procedure Laterality Date   DIAGNOSTIC LAPAROSCOPY WITH REMOVAL OF ECTOPIC PREGNANCY Right 05/07/2019   Procedure: Laparoscopic Removal Of Ectopic Pregnancy;  Surgeon: Reva Bores, MD;  Location: [facility] OR;  Service: Gynecology;  Laterality: Right;   LAPAROSCOPIC CHOLECYSTECTOMY     TUBAL LIGATION  03/2020   UPPER GASTROINTESTINAL ENDOSCOPY       Current Outpatient Medications:    alum & mag hydroxide-simeth (MAALOX MAX) 400-400-40 MG/5ML suspension, Take 15 mLs by mouth every 6 (six) hours as needed for indigestion., Disp: 355 mL, Rfl: 0   aspirin-acetaminophen-caffeine (EXCEDRIN MIGRAINE) 250-250-65 MG tablet, Take by mouth every 6 (six) hours as needed for headache., Disp: , Rfl:    hyoscyamine (LEVSIN) 0.125 MG tablet, One every 6-8 hours as needed, Disp: 60 tablet, Rfl: 1   naproxen (NAPROSYN) 375 MG tablet, Take 1 tablet (375 mg total) by mouth 2 (two) times daily. (Patient taking differently: Take 375 mg by mouth 2 (two) times daily as needed for mild pain.), Disp: 20 tablet, Rfl: 0   ondansetron (ZOFRAN-ODT) 4 MG disintegrating tablet, Take 1 tablet (4 mg  total) by mouth every 8 (eight) hours as needed for nausea or vomiting., Disp: 20 tablet, Rfl: 0  No Known Allergies ROS neg/noncontributory except as noted HPI/below  Went to ER for abdomen pain-pyelo-resolved      Objective:     BP 122/86   Pulse (!) 104   Temp 98.2 F (36.8 C) (Temporal)   Resp 16   Ht 5' 5.5" (1.664 m)   Wt 189 lb (85.7 kg)   LMP 05/18/2022 (Approximate)   SpO2 98%   BMI 30.97 kg/m  Wt Readings from Last 3 Encounters:  06/21/22 189 lb (85.7 kg)  06/09/22 190 lb (86.2 kg)  05/27/22 180 lb (81.6 kg)    Physical Exam   Gen: WDWN NAD HEENT: NCAT, conjunctiva not injected, sclera nonicteric NECK:  supple, no thyromegaly, no nodes, no carotid bruits CARDIAC: RRR, S1S2+, no murmur. LUNGS: CTAB. No wheezes EXT:  no edema MSK: no gross abnormalities.  NEURO: A&O x3.  CN II-XII intact.  PSYCH: normal mood. Good eye contact  Reviewed ER notes     Assessment & Plan:  Chronic tension-type headache, not intractable  Generalized abdominal pain  Hypokalemia   Chronic headache(s)-MRI negative.  Some better w/dietary changes.  Advised neck stretches, massage, limit caffeine.  Monitor.  Abdomen pain-seeing gastroenterology-trying hyoscamine Hypokalemia-mild-take OTC (available over the counter without a prescription) daily.   Return for canc cpe end of june  and sch later.Angelena Sole, MD

## 2022-06-24 ENCOUNTER — Other Ambulatory Visit: Payer: Medicaid Other

## 2022-06-24 ENCOUNTER — Ambulatory Visit: Payer: Medicaid Other | Admitting: Nurse Practitioner

## 2022-06-24 ENCOUNTER — Inpatient Hospital Stay: Payer: BC Managed Care – PPO | Admitting: Nurse Practitioner

## 2022-06-24 ENCOUNTER — Inpatient Hospital Stay: Payer: BC Managed Care – PPO | Attending: Family Medicine

## 2022-07-12 ENCOUNTER — Encounter: Payer: Medicaid Other | Admitting: Family Medicine

## 2022-07-15 ENCOUNTER — Encounter: Payer: Medicaid Other | Admitting: Family Medicine

## 2022-09-15 ENCOUNTER — Ambulatory Visit: Payer: BC Managed Care – PPO | Admitting: Gastroenterology

## 2022-09-15 NOTE — Progress Notes (Deleted)
     [facility] Progress Note  Chief Complaint: ***  Subjective  History: Sally Haas follows up for abdominal pain and diarrhea.  Chronic symptoms outlined in April 2024 office consult note with Dr. Myrtie Neither.  Essentially right sided burning abdominal pain with diarrhea that she felt was reminiscent of previous gallbladder symptoms prior to cholecystectomy.  Symptoms resulting in food avoidance and weight loss for few months. EGD and colonoscopy (to the TI) along with duodenal and colon biopsies all normal May 2024.  Recommended trial of hyoscyamine and, if not effective, then consider trial of bile acid binding agent. _________________________  No-show to GI clinic 09/15/2022 ***  ROS: Cardiovascular:  no chest pain Respiratory: no dyspnea  The patient's Past Medical, Family and Social History were reviewed and are on file in the EMR.  Objective:  Med list reviewed  Current Outpatient Medications:    alum & mag hydroxide-simeth (MAALOX MAX) 400-400-40 MG/5ML suspension, Take 15 mLs by mouth every 6 (six) hours as needed for indigestion., Disp: 355 mL, Rfl: 0   aspirin-acetaminophen-caffeine (EXCEDRIN MIGRAINE) 250-250-65 MG tablet, Take by mouth every 6 (six) hours as needed for headache., Disp: , Rfl:    hyoscyamine (LEVSIN) 0.125 MG tablet, One every 6-8 hours as needed, Disp: 60 tablet, Rfl: 1   naproxen (NAPROSYN) 375 MG tablet, Take 1 tablet (375 mg total) by mouth 2 (two) times daily. (Patient taking differently: Take 375 mg by mouth 2 (two) times daily as needed for mild pain.), Disp: 20 tablet, Rfl: 0   ondansetron (ZOFRAN-ODT) 4 MG disintegrating tablet, Take 1 tablet (4 mg total) by mouth every 8 (eight) hours as needed for nausea or vomiting., Disp: 20 tablet, Rfl: 0   Vital signs in last 24 hrs: There were no vitals filed for this visit. Wt Readings from Last 3 Encounters:  06/21/22 189 lb (85.7 kg)  06/09/22 190 lb (86.2 kg)  05/27/22 180 lb (81.6 kg)    Physical  Exam  *** HEENT: sclera anicteric, oral mucosa moist without lesions Neck: supple, no thyromegaly, JVD or lymphadenopathy Cardiac: ***,  no peripheral edema Pulm: clear to auscultation bilaterally, normal RR and effort noted Abdomen: soft, *** tenderness, with active bowel sounds. No guarding or palpable hepatosplenomegaly. Skin; warm and dry, no jaundice or rash  Labs:   ___________________________________________ Radiologic studies:   ____________________________________________ Other: 1. Surgical [P], duodenal bx DUODENAL MUCOSA WITH PRESERVED VILLOGLANDULAR ARCHITECTURE WITHOUT INCREASED INTRAEPITHELIAL LYMPHOCYTES OR EVIDENCE OF ACTIVE INFLAMMATION. NO EVIDENCE OF GLUTEN SENSITIVE ENTEROPATHY. 2. Surgical [P], colon nos, random sites COLONIC MUCOSA WITH NO SIGNIFICANT DIAGNOSTIC ALTERATION. NO EVIDENCE OF LYMPHOCYTIC COLITIS OR COLLAGENOUS COLITIS. NO EVIDENCE OF ACTIVITY, CHRONICITY, GRANULOMA, DYSPLASIA OR MALIGNANCY.  _____________________________________________ Assessment & Plan  Assessment: No diagnosis found.    Plan:   *** minutes were spent on this encounter (including chart review, history/exam, counseling/coordination of care, and documentation) > 50% of that time was spent on counseling and coordination of care.   Charlie Pitter III

## 2022-10-25 ENCOUNTER — Encounter: Payer: Self-pay | Admitting: Family Medicine

## 2022-10-25 ENCOUNTER — Ambulatory Visit (INDEPENDENT_AMBULATORY_CARE_PROVIDER_SITE_OTHER): Payer: BC Managed Care – PPO | Admitting: Family Medicine

## 2022-10-25 VITALS — BP 104/66 | HR 76 | Temp 98.4°F | Resp 18 | Ht 65.5 in | Wt 195.5 lb

## 2022-10-25 DIAGNOSIS — Z0000 Encounter for general adult medical examination without abnormal findings: Secondary | ICD-10-CM | POA: Diagnosis not present

## 2022-10-25 DIAGNOSIS — R300 Dysuria: Secondary | ICD-10-CM | POA: Diagnosis not present

## 2022-10-25 LAB — CBC WITH DIFFERENTIAL/PLATELET
Basophils Absolute: 0 10*3/uL (ref 0.0–0.1)
Basophils Relative: 0.8 % (ref 0.0–3.0)
Eosinophils Absolute: 0 10*3/uL (ref 0.0–0.7)
Eosinophils Relative: 1 % (ref 0.0–5.0)
HCT: 40.6 % (ref 36.0–46.0)
Hemoglobin: 13.7 g/dL (ref 12.0–15.0)
Lymphocytes Relative: 32.1 % (ref 12.0–46.0)
Lymphs Abs: 1.5 10*3/uL (ref 0.7–4.0)
MCHC: 33.9 g/dL (ref 30.0–36.0)
MCV: 88.5 fL (ref 78.0–100.0)
Monocytes Absolute: 0.4 10*3/uL (ref 0.1–1.0)
Monocytes Relative: 7.7 % (ref 3.0–12.0)
Neutro Abs: 2.8 10*3/uL (ref 1.4–7.7)
Neutrophils Relative %: 58.4 % (ref 43.0–77.0)
Platelets: 135 10*3/uL — ABNORMAL LOW (ref 150.0–400.0)
RBC: 4.59 Mil/uL (ref 3.87–5.11)
RDW: 13 % (ref 11.5–15.5)
WBC: 4.8 10*3/uL (ref 4.0–10.5)

## 2022-10-25 LAB — POC URINALSYSI DIPSTICK (AUTOMATED)
Bilirubin, UA: NEGATIVE
Blood, UA: NEGATIVE
Glucose, UA: NEGATIVE
Ketones, UA: NEGATIVE
Leukocytes, UA: NEGATIVE
Nitrite, UA: NEGATIVE
Protein, UA: NEGATIVE
Spec Grav, UA: 1.025 (ref 1.010–1.025)
Urobilinogen, UA: 0.2 U/dL
pH, UA: 6 (ref 5.0–8.0)

## 2022-10-25 LAB — COMPREHENSIVE METABOLIC PANEL
ALT: 13 U/L (ref 0–35)
AST: 17 U/L (ref 0–37)
Albumin: 4.3 g/dL (ref 3.5–5.2)
Alkaline Phosphatase: 48 U/L (ref 39–117)
BUN: 12 mg/dL (ref 6–23)
CO2: 25 meq/L (ref 19–32)
Calcium: 8.9 mg/dL (ref 8.4–10.5)
Chloride: 106 meq/L (ref 96–112)
Creatinine, Ser: 0.67 mg/dL (ref 0.40–1.20)
GFR: 121.54 mL/min (ref >60.00)
Glucose, Bld: 89 mg/dL (ref 70–99)
Potassium: 3.9 meq/L (ref 3.5–5.1)
Sodium: 138 meq/L (ref 135–145)
Total Bilirubin: 0.6 mg/dL (ref 0.2–1.2)
Total Protein: 6.9 g/dL (ref 6.0–8.3)

## 2022-10-25 LAB — LIPID PANEL
Cholesterol: 132 mg/dL (ref 0–200)
HDL: 54.5 mg/dL (ref >39.00)
LDL Cholesterol: 67 mg/dL (ref 0–99)
NonHDL: 77.24
Total CHOL/HDL Ratio: 2
Triglycerides: 52 mg/dL (ref 0.0–149.0)
VLDL: 10.4 mg/dL (ref 0.0–40.0)

## 2022-10-25 LAB — TSH: TSH: 0.57 u[IU]/mL (ref 0.35–5.50)

## 2022-10-25 LAB — HEMOGLOBIN A1C: Hgb A1c MFr Bld: 4.8 % (ref 4.6–6.5)

## 2022-10-25 MED ORDER — SULFAMETHOXAZOLE-TRIMETHOPRIM 800-160 MG PO TABS
1.0000 | ORAL_TABLET | Freq: Two times a day (BID) | ORAL | 0 refills | Status: DC
Start: 2022-10-25 — End: 2022-11-09

## 2022-10-25 NOTE — Patient Instructions (Signed)

## 2022-10-25 NOTE — Progress Notes (Signed)
Labs look great.  Urine was negative.  She can take the antibiotics or wait until the culture is back.  Should make an appointment with GYN due to the pain.

## 2022-10-25 NOTE — Progress Notes (Signed)
Phone [phone]   Subjective:   Patient is a 27 y.o. female presenting for annual physical.    Chief Complaint  Patient presents with   Annual Exam    CPE Fasting Will get pap done at gyn   Annual - Pt states she is not exercising but is active at work, remaining on her feet for 12 hours a day. States she wants to improve her exercise and diet.   Headaches - She states she has reduced her caffeine intake, is drinking about half a celsius along with 40 ounces of water a day.   UTI - Endorses constant UTI's that are worse with intercourse and pain while urinating. Complains of pain during and after intercourse.   Low Platelets - Low platelet levels in May. Checking again today has seen onc but missed last f/u  See problem oriented charting- ROS- ROS: Gen: no fever, chills  Skin: no rash, itching ENT: no ear pain, ear drainage, nasal congestion, rhinorrhea, sinus pressure, sore throat Eyes: no blurry vision, double vision Resp: no cough, wheeze,SOB CV: no, LE edema, +CP, palpitations-old GI: no heartburn, n/v/d/c, abd pain GU: no dysuria, urgency, frequency, hematuria MSK: no joint pain, myalgias, back pain Neuro: no dizziness, weakness, vertigo, +headache,  Psych: no depression, anxiety, insomnia, SI   The following were reviewed and entered/updated in epic: Past Medical History:  Diagnosis Date   Bipolar disease, chronic (HCC)    Ectopic pregnancy    Kidney stone    Thrombocytopenia [facility])    Patient Active Problem List   Diagnosis Date Noted   Chronic tension-type headache, not intractable 06/21/2022   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   Supervision of low-risk pregnancy 07/16/2019   Depression affecting pregnancy 07/16/2019   Rh negative state in antepartum period 05/07/2019   Abdominal pain in pregnancy 05/06/2019   Pregnancy of unknown anatomic location 05/06/2019   Thrombocytopenia (HCC) 05/06/2019   Past Surgical History:  Procedure  Laterality Date   DIAGNOSTIC LAPAROSCOPY WITH REMOVAL OF ECTOPIC PREGNANCY Right 05/07/2019   Procedure: Laparoscopic Removal Of Ectopic Pregnancy;  Surgeon: Reva Bores, MD;  Location: [facility] OR;  Service: Gynecology;  Laterality: Right;   LAPAROSCOPIC CHOLECYSTECTOMY     TUBAL LIGATION  03/2020   UPPER GASTROINTESTINAL ENDOSCOPY      Family History  Problem Relation Age of Onset   Cancer Mother    Alcohol abuse Father    Diabetes Paternal Aunt    Cancer Maternal Grandmother    Colon cancer Paternal Grandmother    Cancer Paternal Grandmother    Colon cancer Paternal Grandfather    Cancer Paternal Grandfather    Stomach cancer Neg Hx    Esophageal cancer Neg Hx     Medications- reviewed and updated Current Outpatient Medications  Medication Sig Dispense Refill   alum & mag hydroxide-simeth (MAALOX MAX) 400-400-40 MG/5ML suspension Take 15 mLs by mouth every 6 (six) hours as needed for indigestion. 355 mL 0   aspirin-acetaminophen-caffeine (EXCEDRIN MIGRAINE) 250-250-65 MG tablet Take by mouth every 6 (six) hours as needed for headache.     hyoscyamine (LEVSIN) 0.125 MG tablet One every 6-8 hours as needed 60 tablet 1   naproxen (NAPROSYN) 375 MG tablet Take 1 tablet (375 mg total) by mouth 2 (two) times daily. (Patient taking differently: Take 375 mg by mouth 2 (two) times daily as needed for mild pain.) 20 tablet 0   ondansetron (ZOFRAN-ODT) 4 MG disintegrating tablet Take 1 tablet (4 mg total) by mouth  every 8 (eight) hours as needed for nausea or vomiting. 20 tablet 0   sulfamethoxazole-trimethoprim (BACTRIM DS) 800-160 MG tablet Take 1 tablet by mouth 2 (two) times daily. 6 tablet 0   No current facility-administered medications for this visit.    Allergies-reviewed and updated No Known Allergies  Social History   Social History Scientist, research (medical) and part time work-business.  Working [employer].     Objective  Objective:  BP 104/66   Pulse 76   Temp 98.4 F (36.9 C)  (Temporal)   Resp 18   Ht 5' 5.5" (1.664 m)   Wt 195 lb 8 oz (88.7 kg)   LMP 10/05/2022 (Exact Date)   SpO2 98%   BMI 32.04 kg/m  Physical Exam  Gen: WDWN NAD HEENT: NCAT, conjunctiva not injected, sclera nonicteric TM WNL B, OP moist, no exudates  NECK:  supple, no thyromegaly, no nodes, no carotid bruits CARDIAC: RRR, S1S2+, no murmur. DP 2+B LUNGS: CTAB. No wheezes ABDOMEN:  BS+, soft, No HSM, no masses +Tender suprapubically EXT:  no edema MSK: no gross abnormalities. MS 5/5 all 4 NEURO: A&O x3.  CN II-XII intact.  PSYCH: normal mood. Good eye contact   UA neg   Assessment and Plan   Health Maintenance counseling: 1. Anticipatory guidance: Patient counseled regarding regular dental exams q6 months, eye exams,  avoiding smoking and second hand smoke, limiting alcohol to 1 beverage per day, no illicit drugs.   2. Risk factor reduction:  Advised patient of need for regular exercise and diet rich and fruits and vegetables to reduce risk of heart attack and stroke.  Exercise- Not exercising.  Wt Readings from Last 3 Encounters:  10/25/22 195 lb 8 oz (88.7 kg)  06/21/22 189 lb (85.7 kg)  06/09/22 190 lb (86.2 kg)   3. Immunizations/screenings/ancillary studies Immunization History  Administered Date(s) Administered   HPV 9-valent 07/08/2021   Influenza,inj,Quad PF,6+ Mos 11/27/2019   PFIZER(Purple Top)SARS-COV-2 Vaccination 06/20/2019   Tdap 03/16/2017, 11/27/2019   Health Maintenance Due  Topic Date Due   HPV VACCINES (2 - 3-dose series) 08/05/2021   Cervical Cancer Screening (Pap smear)  07/24/2022    4. Cervical cancer screening: due, July 2021 5. Skin cancer screening- advised regular sunscreen use. Denies worrisome, changing, or new skin lesions.  6. Birth control/STD check: surgical birth control 7. Smoking associated screening: non smoker 8. Alcohol screening: n/a  Wellness examination -     CBC with Differential/Platelet -     Comprehensive metabolic  panel -     Lipid panel -     TSH -     Hemoglobin A1c -     POCT Urinalysis Dipstick (Automated) -     Urine Culture  Dysuria -     POCT Urinalysis Dipstick (Automated) -     Urine Culture  Other orders -     Sulfamethoxazole-Trimethoprim; Take 1 tablet by mouth 2 (two) times daily.  Dispense: 6 tablet; Refill: 0   Wellness-anticipatory guidance.  Work on Diet/Exercise  Check CBC,CMP,lipids,TSH, A1C.  F/u 1 yr  Dysuria-gets frequently-check ucx.  Bactrim ds bid x 3 days.  Pt will f/u gyn  Recommended follow up: Return in about 1 year (around 10/25/2023) for annual physical.  Lab/Order associations: fasting  I,Anaiya N Rice,acting as a scribe for Angelena Sole, MD.,have documented all relevant documentation on the behalf of Angelena Sole, MD,as directed by  Angelena Sole, MD while in the presence of Angelena Sole,  MD.  Mardella Layman, MD, have reviewed all documentation for this visit. The documentation on 10/25/22 for the exam, diagnosis, procedures, and orders are all accurate and complete.   Angelena Sole, MD

## 2022-10-26 LAB — URINE CULTURE
MICRO NUMBER:: 15560940
SPECIMEN QUALITY:: ADEQUATE

## 2022-11-09 ENCOUNTER — Other Ambulatory Visit: Payer: Self-pay

## 2022-11-09 ENCOUNTER — Ambulatory Visit (HOSPITAL_COMMUNITY)
Admission: EM | Admit: 2022-11-09 | Discharge: 2022-11-09 | Disposition: A | Discharge status: Home / Self Care | Payer: BC Managed Care – PPO | Attending: Family Medicine | Admitting: Family Medicine

## 2022-11-09 ENCOUNTER — Encounter (HOSPITAL_COMMUNITY): Payer: Self-pay

## 2022-11-09 ENCOUNTER — Emergency Department (HOSPITAL_COMMUNITY)
Admission: EM | Admit: 2022-11-09 | Discharge: 2022-11-09 | Discharge status: Against Medical Advice | Payer: BC Managed Care – PPO | Attending: Emergency Medicine | Admitting: Emergency Medicine

## 2022-11-09 DIAGNOSIS — R197 Diarrhea, unspecified: Secondary | ICD-10-CM | POA: Diagnosis not present

## 2022-11-09 DIAGNOSIS — R1031 Right lower quadrant pain: Secondary | ICD-10-CM | POA: Diagnosis not present

## 2022-11-09 DIAGNOSIS — R110 Nausea: Secondary | ICD-10-CM | POA: Insufficient documentation

## 2022-11-09 DIAGNOSIS — Z5321 Procedure and treatment not carried out due to patient leaving prior to being seen by health care provider: Secondary | ICD-10-CM | POA: Insufficient documentation

## 2022-11-09 DIAGNOSIS — R109 Unspecified abdominal pain: Secondary | ICD-10-CM | POA: Diagnosis present

## 2022-11-09 LAB — URINALYSIS, ROUTINE W REFLEX MICROSCOPIC
Bilirubin Urine: NEGATIVE
Glucose, UA: NEGATIVE mg/dL
Ketones, ur: NEGATIVE mg/dL
Leukocytes,Ua: NEGATIVE
Nitrite: NEGATIVE
Protein, ur: NEGATIVE mg/dL
RBC / HPF: >50 RBC/hpf (ref 0–5)
Specific Gravity, Urine: 1.024 (ref 1.005–1.030)
pH: 5 (ref 5.0–8.0)

## 2022-11-09 LAB — COMPREHENSIVE METABOLIC PANEL
ALT: 20 U/L (ref 0–44)
AST: 31 U/L (ref 15–41)
Albumin: 4.2 g/dL (ref 3.5–5.0)
Alkaline Phosphatase: 44 U/L (ref 38–126)
Anion gap: 7 (ref 5–15)
BUN: 9 mg/dL (ref 6–20)
CO2: 23 mmol/L (ref 22–32)
Calcium: 8.9 mg/dL (ref 8.9–10.3)
Chloride: 108 mmol/L (ref 98–111)
Creatinine, Ser: 0.64 mg/dL (ref 0.44–1.00)
GFR, Estimated: >60 mL/min (ref >60)
Glucose, Bld: 89 mg/dL (ref 70–99)
Potassium: 4.4 mmol/L (ref 3.5–5.1)
Sodium: 138 mmol/L (ref 135–145)
Total Bilirubin: 1.4 mg/dL — ABNORMAL HIGH (ref 0.3–1.2)
Total Protein: 7.1 g/dL (ref 6.5–8.1)

## 2022-11-09 LAB — CBC WITH DIFFERENTIAL/PLATELET
Abs Immature Granulocytes: 0.01 10*3/uL (ref 0.00–0.07)
Basophils Absolute: 0 10*3/uL (ref 0.0–0.1)
Basophils Relative: 1 %
Eosinophils Absolute: 0 10*3/uL (ref 0.0–0.5)
Eosinophils Relative: 1 %
HCT: 41.4 % (ref 36.0–46.0)
Hemoglobin: 13.8 g/dL (ref 12.0–15.0)
Immature Granulocytes: 0 %
Lymphocytes Relative: 31 %
Lymphs Abs: 1.6 10*3/uL (ref 0.7–4.0)
MCH: 30.1 pg (ref 26.0–34.0)
MCHC: 33.3 g/dL (ref 30.0–36.0)
MCV: 90.2 fL (ref 80.0–100.0)
Monocytes Absolute: 0.3 10*3/uL (ref 0.1–1.0)
Monocytes Relative: 5 %
Neutro Abs: 3.2 10*3/uL (ref 1.7–7.7)
Neutrophils Relative %: 62 %
Platelets: 123 10*3/uL — ABNORMAL LOW (ref 150–400)
RBC: 4.59 MIL/uL (ref 3.87–5.11)
RDW: 12.4 % (ref 11.5–15.5)
WBC: 5.1 10*3/uL (ref 4.0–10.5)
nRBC: 0 % (ref 0.0–0.2)

## 2022-11-09 LAB — LIPASE, BLOOD: Lipase: 28 U/L (ref 11–51)

## 2022-11-09 LAB — HCG, SERUM, QUALITATIVE: Preg, Serum: NEGATIVE

## 2022-11-09 LAB — POCT URINE PREGNANCY: Preg Test, Ur: NEGATIVE

## 2022-11-09 MED ORDER — ONDANSETRON 4 MG PO TBDP
4.0000 mg | ORAL_TABLET | Freq: Once | ORAL | Status: AC
  Administered 2022-11-09: 4 mg via ORAL
  Filled 2022-11-09: qty 1

## 2022-11-09 NOTE — ED Notes (Signed)
Patient is being discharged from the Urgent Care and sent to the Emergency Department via POV . Per Loreta Ave, MD, patient is in need of higher level of care due to abdominal pain. Patient is aware and verbalizes understanding of plan of care.  Vitals:   11/09/22 1505  BP: 132/85  Pulse: 76  Resp: 16  Temp: 98.4 F (36.9 C)  SpO2: 98%

## 2022-11-09 NOTE — ED Provider Triage Note (Signed)
Emergency Medicine Provider Triage Evaluation Note  Sally Haas , a 27 y.o. female  was evaluated in triage.  Pt complains of belly pain located just to the right of her umbilicus.  Onset 7 AM, constant, worse with driving in her car.  Associated with nausea, no vomiting.  States that she has had a little diarrhea today.  Prior abdominal surgery includes cholecystectomy..  Review of Systems  Positive:  Negative:   Physical Exam  BP 129/82 (BP Location: Right Arm)   Pulse 79   Temp 98.4 F (36.9 C) (Oral)   Resp 16   Ht 5' 6.5" (1.689 m)   Wt 86.2 kg   LMP 11/09/2022 (Exact Date)   SpO2 100%   BMI 30.21 kg/m  Gen:   Awake, no distress   Resp:  Normal effort  MSK:   Moves extremities without difficulty  Other:  RLQ TTP with rebound tenderness   Medical Decision Making  Medically screening exam initiated at 4:53 PM.  Appropriate orders placed.  NATHASHA MCGILLIVRAY was informed that the remainder of the evaluation will be completed by another provider, this initial triage assessment does not replace that evaluation, and the importance of remaining in the ED until their evaluation is complete.     Jeannie Fend, PA-C 11/09/22 [phone]

## 2022-11-09 NOTE — ED Triage Notes (Signed)
Patient with constant R sided abdominal pain with associated nausea and diarrhea.

## 2022-11-09 NOTE — ED Provider Notes (Signed)
[facility]    CSN: [id number] Arrival date & time: 11/09/22  1451      History   Chief Complaint Chief Complaint  Patient presents with   Abdominal Pain    HPI Sally Haas is a 27 y.o. female.    Abdominal Pain Here for right lower quadrant pain began this morning at 7 in the morning.  She had decreased appetite last night and has had nausea today.  No vomiting.  She has had loose stools a couple times today.  She started a menstrual cycle today and this cycle was about 8 days late.  No dysuria and no hematuria otherwise.  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   Supervision of low-risk pregnancy 07/16/2019   Depression affecting pregnancy 07/16/2019   Rh negative state in antepartum period 05/07/2019   Abdominal pain in pregnancy 05/06/2019   Pregnancy of unknown anatomic location 05/06/2019   Thrombocytopenia (HCC) 05/06/2019    Past Surgical History:  Procedure Laterality Date   DIAGNOSTIC LAPAROSCOPY WITH REMOVAL OF ECTOPIC PREGNANCY Right 05/07/2019   Procedure: Laparoscopic Removal Of Ectopic Pregnancy;  Surgeon: Reva Bores, MD;  Location: [facility] OR;  Service: Gynecology;  Laterality: Right;   LAPAROSCOPIC CHOLECYSTECTOMY     TUBAL LIGATION  03/2020   UPPER GASTROINTESTINAL ENDOSCOPY      OB History     Gravida  4   Para  1   Term  1   Preterm      AB  2   Living  1      SAB  1   IAB      Ectopic  1   Multiple      Live Births  1        Obstetric Comments  G1: early SAB G2: TSVD 2019           Home Medications    Prior to Admission  medications   Medication Sig Start Date End Date Taking? Authorizing Provider  naproxen (NAPROSYN) 375 MG tablet Take 1 tablet (375 mg total) by mouth 2 (two) times daily. Patient taking differently: Take 375 mg by mouth 2 (two) times daily as needed for mild pain. 05/27/22   Kommor, Wyn Forster, MD    Family History Family History  Problem Relation Age of Onset   Cancer Mother    Alcohol abuse Father    Diabetes Paternal Aunt    Cancer Maternal Grandmother    Colon cancer Paternal Grandmother    Cancer Paternal Grandmother    Colon cancer Paternal Grandfather    Cancer Paternal Grandfather    Stomach cancer Neg Hx    Esophageal cancer Neg Hx     Social History Social History   Tobacco Use   Smoking status: Never   Smokeless tobacco: Never  Vaping Use   Vaping status: Never Used  Substance Use Topics   Alcohol use: Not Currently   Drug use: Never     Allergies   Patient has no known allergies.   Review of Systems Review of Systems  Gastrointestinal:  Positive for abdominal pain.     Physical Exam Triage Vital Signs ED Triage Vitals  Encounter Vitals Group     BP 11/09/22 1505 132/85     Systolic BP Percentile --      Diastolic BP Percentile --      Pulse Rate 11/09/22 1505 76     Resp 11/09/22 1505 16     Temp 11/09/22 1505 98.4 F (36.9 C)     Temp Source 11/09/22 1505 Oral     SpO2 11/09/22 1505 98 %     Weight --      Height --      Head Circumference --      Peak Flow --      Pain Score 11/09/22 1506 9     Pain Loc --      Pain Education --      Exclude from Growth Chart --    No data found.  Updated Vital Signs BP 132/85 (BP Location: Right Arm)   Pulse 76   Temp 98.4 F (36.9 C) (Oral)   Resp 16   LMP 11/09/2022 (Exact Date)   SpO2 98%   Visual Acuity Right Eye Distance:   Left Eye Distance:   Bilateral Distance:    Right Eye Near:   Left Eye Near:    Bilateral Near:     Physical Exam Vitals reviewed.  Constitutional:       Appearance: She is not ill-appearing, toxic-appearing or diaphoretic.     Comments: NARD, but facies is pained  HENT:     Mouth/Throat:     Mouth: Mucous membranes are moist.  Eyes:     Extraocular Movements: Extraocular movements intact.     Conjunctiva/sclera: Conjunctivae normal.     Pupils: Pupils are equal, round, and reactive to light.  Cardiovascular:     Rate and Rhythm: Normal rate and regular rhythm.     Heart sounds: No murmur heard. Pulmonary:     Effort: Pulmonary effort is normal.     Breath sounds: Normal breath sounds.  Abdominal:     Comments: Hypoactive bowel sounds.  There is generalized tenderness, which is worse in the right lower quadrant and suprapubic area.    Musculoskeletal:     Cervical back: Neck supple.  Lymphadenopathy:     Cervical: No cervical adenopathy.  Skin:    Capillary Refill: Capillary refill takes less than 2 seconds.     Coloration: Skin is not pale.  Neurological:     General: No focal deficit present.     Mental Status: She is alert and oriented to person, place, and time.  Psychiatric:        Behavior: Behavior normal.      UC Treatments / Results  Labs (all labs ordered are listed, but only abnormal results are displayed) Labs Reviewed  POCT URINE PREGNANCY    EKG   Radiology No results found.  Procedures Procedures (including critical care time)  Medications Ordered in UC Medications - No data to display  Initial Impression / Assessment and Plan / UC Course  I have reviewed the triage vital signs and the nursing notes.  Pertinent labs & imaging results that were available during my care of the patient were reviewed by me and considered in my medical decision making (see chart for details).      UPT is negative.  I have asked her to proceed to the emergency room by private vehicle for further evaluation and treatment for her  right lower quadrant pain.   Final Clinical Impressions(s) / UC Diagnoses   Final  diagnoses:  Right lower quadrant abdominal pain     Discharge Instructions      Please go to the ER for further eval  Your pregnancy test here was negative.     ED Prescriptions   None    PDMP not reviewed this encounter.   Zenia Resides, MD 11/09/22 [phone]

## 2022-11-09 NOTE — ED Notes (Signed)
Pt states they are leaving  

## 2022-11-09 NOTE — ED Triage Notes (Signed)
Pt complaining of sharp, shooting pain in RLQ at this time. Pain began this AM at 0700. Pt states her appetite was decreased last night. No medications taken for pt symptoms at this time.

## 2022-11-09 NOTE — Discharge Instructions (Signed)
Please go to the ER for further eval  Your pregnancy test here was negative.

## 2022-11-16 ENCOUNTER — Encounter: Payer: Self-pay | Admitting: Family Medicine

## 2022-11-16 ENCOUNTER — Ambulatory Visit: Payer: BC Managed Care – PPO | Admitting: Family Medicine

## 2022-11-16 VITALS — BP 126/86 | HR 90 | Ht 66.0 in | Wt 193.4 lb

## 2022-11-16 DIAGNOSIS — R1084 Generalized abdominal pain: Secondary | ICD-10-CM

## 2022-11-16 MED ORDER — AMOXICILLIN-POT CLAVULANATE 875-125 MG PO TABS: 1.0000 | ORAL_TABLET | Freq: Two times a day (BID) | ORAL | 0 refills | Status: DC

## 2022-11-16 NOTE — Progress Notes (Addendum)
Subjective:    Patient ID: Sally Haas, female    DOB: August 24, 1995, 27 y.o.   MRN: [id number]  Chief Complaint  Patient presents with   Follow-up    ED F/U. 10/22; Pt states she woke up with severe R abdominal pain; Pt went to ED but was triaged. Only had bloodwork done, no imaging. Pt states she still has the abdominal pain but it is not as severe.     HPI - Fasting: since yesteday, water today  Abd Pain (RUQ) - Presented to [facility] ED on 11/09/22 for constant RLQ abdominal pain to the right of her umbilicus that started 7 AM and was worsened while driving; endorses associated nausea and mild diarrhea. Bilirubin 1.4; Preg test NEG; UA hazy, large, rare; platelets 123. Given Zofran 4 mg. Left before full evaluation. Today she reports spontaneous onset of sharp pain in mid-RUQ around 7AM on 10/22; notes previous night she felt overly full while eating. States pain has persisted throughout the weak, but has lessened slightly. Endorses worsening pain with bending/twisting/turning, associated nausea/diarrhea (once daily)/vomiting (Saturday night), and hot flashes; denies fever. Reports she hasn't had any normal BMs since 10/22. Saw her OBGYN on 10/28 for pelvic pain & incontinence which was concluded to be non-contributory to abd pain. Used Tylenol to manage pain. Also notes pain in superior right shoulder similar to when she experienced an ectopic pregnancy.  Pap - Plans to schedule.  Health Maintenance Due  Topic Date Due   HPV VACCINES (2 - 3-dose series) 08/05/2021   Cervical Cancer Screening (Pap smear)  07/24/2022   COVID-19 Vaccine (2 - 2023-24 season) 09/19/2022    Past Medical History:  Diagnosis Date   Bipolar disease, chronic (HCC)    Ectopic pregnancy    Kidney stone    Thrombocytopenia [facility])     Past Surgical History:  Procedure Laterality Date   DIAGNOSTIC LAPAROSCOPY WITH REMOVAL OF ECTOPIC PREGNANCY Right 05/07/2019   Procedure: Laparoscopic Removal Of Ectopic  Pregnancy;  Surgeon: Reva Bores, MD;  Location: [facility] OR;  Service: Gynecology;  Laterality: Right;   LAPAROSCOPIC CHOLECYSTECTOMY     TUBAL LIGATION  03/2020   UPPER GASTROINTESTINAL ENDOSCOPY     Current Outpatient Medications:    amoxicillin-clavulanate (AUGMENTIN) 875-125 MG tablet, Take 1 tablet by mouth 2 (two) times daily., Disp: 20 tablet, Rfl: 0   naproxen (NAPROSYN) 375 MG tablet, Take 1 tablet (375 mg total) by mouth 2 (two) times daily. (Patient not taking: Reported on 11/16/2022), Disp: 20 tablet, Rfl: 0  No Known Allergies ROS neg/noncontributory except as noted HPI/below  Objective:  BP 126/86   Pulse 90   Ht 5\' 6"  (1.676 m)   Wt 193 lb 6.4 oz (87.7 kg)   LMP 11/15/2022 (Exact Date)   SpO2 98%   BMI 31.22 kg/m  Wt Readings from Last 3 Encounters:  11/16/22 193 lb 6.4 oz (87.7 kg)  11/09/22 190 lb (86.2 kg)  10/25/22 195 lb 8 oz (88.7 kg)   Physical Exam  Gen: WDWN NAD HEENT: NCAT, conjunctiva not injected, sclera nonicteric NECK:  supple, no thyromegaly, no nodes, no carotid bruits CARDIAC: RRR, S1S2+, no murmur. DP 2+B LUNGS: CTAB. No wheezes ABDOMEN:  BS+, soft, +Moderate abd tenderness diffuse, esp on left, No HSM, no masses EXT:  no edema MSK: no gross abnormalities.  NEURO: A&O x3.  CN II-XII intact.  PSYCH: normal mood. Good eye contact  No tenderness to palpation to back and right shoulder  Reviewed ER &  GYN records Assessment & Plan:  Generalized abdominal pain -     CT ABDOMEN PELVIS W CONTRAST; Future  Other orders -     Amoxicillin-Pot Clavulanate; Take 1 tablet by mouth 2 (two) times daily.  Dispense: 20 tablet; Refill: 0   Abd pain-started more R, now more L.  Also w/diarrhea.  ?partial obstruction, ?diverticulitis, ? Mass.  Labs, gyn w/u have been unremarkable.  ? Scar tissue RUQ as well-prev cholecystectomy.  Check CT abd/pelvis.augmentin 875 bid sent to pharm in case ticitis  Peer - peer review for CT approved  [id number]  I,Emily  Lagle,acting as a scribe for Angelena Sole, MD.,have documented all relevant documentation on the behalf of Angelena Sole, MD,as directed by  Angelena Sole, MD while in the presence of Angelena Sole, MD.  I, Angelena Sole, MD, have reviewed all documentation for this visit. The documentation on 11/16/22 for the exam, diagnosis, procedures, and orders are all accurate and complete.  (refresh reminder)  Angelena Sole, MD

## 2022-11-17 ENCOUNTER — Ambulatory Visit (HOSPITAL_BASED_OUTPATIENT_CLINIC_OR_DEPARTMENT_OTHER)
Admission: RE | Admit: 2022-11-17 | Discharge: 2022-11-17 | Disposition: A | Discharge status: Home / Self Care | Payer: BC Managed Care – PPO | Source: Ambulatory Visit | Attending: Family Medicine | Admitting: Family Medicine

## 2022-11-17 DIAGNOSIS — R1084 Generalized abdominal pain: Secondary | ICD-10-CM | POA: Diagnosis present

## 2022-11-17 MED ORDER — IOHEXOL 300 MG/ML  SOLN
100.0000 mL | Freq: Once | INTRAMUSCULAR | Status: AC | PRN
  Administered 2022-11-17: 100 mL via INTRAVENOUS

## 2022-11-17 NOTE — Progress Notes (Signed)
D/w pt on 10/30 at 845pm.  Poss kidney infection.  Take the augmentin and let us know if not improving or worsening

## 2023-01-31 ENCOUNTER — Encounter: Payer: Self-pay | Admitting: Family Medicine

## 2023-01-31 ENCOUNTER — Ambulatory Visit: Payer: Self-pay | Admitting: Family Medicine

## 2023-01-31 ENCOUNTER — Ambulatory Visit: Payer: 59 | Admitting: Family Medicine

## 2023-01-31 VITALS — BP 111/74 | HR 60 | Wt 194.2 lb

## 2023-01-31 DIAGNOSIS — N3001 Acute cystitis with hematuria: Secondary | ICD-10-CM | POA: Diagnosis not present

## 2023-01-31 DIAGNOSIS — R300 Dysuria: Secondary | ICD-10-CM | POA: Diagnosis not present

## 2023-01-31 DIAGNOSIS — R109 Unspecified abdominal pain: Secondary | ICD-10-CM

## 2023-01-31 LAB — POC URINALSYSI DIPSTICK (AUTOMATED)
Bilirubin, UA: NEGATIVE
Glucose, UA: NEGATIVE
Ketones, UA: NEGATIVE
Nitrite, UA: NEGATIVE
Protein, UA: POSITIVE — AB
Spec Grav, UA: 1.02 (ref 1.010–1.025)
Urobilinogen, UA: NEGATIVE U/dL — AB
pH, UA: 5.5 (ref 5.0–8.0)

## 2023-01-31 MED ORDER — CEFDINIR 300 MG PO CAPS: 300.0000 mg | ORAL_CAPSULE | Freq: Two times a day (BID) | ORAL | 0 refills | Status: AC

## 2023-01-31 NOTE — Progress Notes (Signed)
 OFFICE VISIT  01/31/2023  CC:  Chief Complaint  Patient presents with   Urinary Concern    A couple days; lower back pain, burning during urination. Denies urinary frequency. Hx of kidney infections. Pt has taken Excedrin for pain, did find relief.      Patient is a 28 y.o. female who presents for dysuria.  HPI: A couple days ago she started feeling pain in the right lower back.  Some burning with urination and nausea has come on since then.  Her symptoms are significantly worse today.  No blood noted in urine but it does appear concentrated. No fever.  No radiation of the pain around the side or into the abdomen or groin.  She has a history of pyelo (CT abd/pelv with contrast 11/17/22 worrisome for R pyelonephritis.)  Past Medical History:  Diagnosis Date   Bipolar disease, chronic (HCC)    Ectopic pregnancy    Kidney stone    Thrombocytopenia [facility])     Past Surgical History:  Procedure Laterality Date   DIAGNOSTIC LAPAROSCOPY WITH REMOVAL OF ECTOPIC PREGNANCY Right 05/07/2019   Procedure: Laparoscopic Removal Of Ectopic Pregnancy;  Surgeon: Fredirick Glenys RAMAN, MD;  Location: [facility] OR;  Service: Gynecology;  Laterality: Right;   LAPAROSCOPIC CHOLECYSTECTOMY     TUBAL LIGATION  03/2020   UPPER GASTROINTESTINAL ENDOSCOPY      Outpatient Medications Prior to Visit  Medication Sig Dispense Refill   amoxicillin -clavulanate (AUGMENTIN ) 875-125 MG tablet Take 1 tablet by mouth 2 (two) times daily. 20 tablet 0   naproxen  (NAPROSYN ) 375 MG tablet Take 1 tablet (375 mg total) by mouth 2 (two) times daily. (Patient not taking: Reported on 11/16/2022) 20 tablet 0   No facility-administered medications prior to visit.    No Known Allergies  Review of Systems  As per HPI  PE:    01/31/2023    2:39 PM 11/16/2022    2:12 PM 11/09/2022    4:52 PM  Vitals with BMI  Height  5' 6 5' 6.5  Weight 194 lbs 3 oz 193 lbs 6 oz 190 lbs  BMI  31.23 30.21  Systolic 111 126   Diastolic 74 86    Pulse 60 90      Physical Exam  Gen: Alert, well appearing.  Patient is oriented to person, place, time, and situation. AFFECT: pleasant, lucid thought and speech. No CVA or back/flank/side tenderness.  LABS:  Last metabolic panel Lab Results  Component Value Date   GLUCOSE 89 11/09/2022   NA 138 11/09/2022   K 4.4 11/09/2022   CL 108 11/09/2022   CO2 23 11/09/2022   BUN 9 11/09/2022   CREATININE 0.64 11/09/2022   GFRNONAA >60 11/09/2022   CALCIUM 8.9 11/09/2022   PROT 7.1 11/09/2022   ALBUMIN 4.2 11/09/2022   BILITOT 1.4 (H) 11/09/2022   ALKPHOS 44 11/09/2022   AST 31 11/09/2022   ALT 20 11/09/2022   ANIONGAP 7 11/09/2022    IMPRESSION AND PLAN:  UTI with microhematuria. History of pyelonephritis. Dipstick UA today showed 3+ blood and 1+ leu, SG 1.030.  Otherwise normal. Specimen sent for c/s. Start cefdinir  300 mg twice daily x 7 days.  An After Visit Summary was printed and given to the patient.  FOLLOW UP: No follow-ups on file.  Signed:  Gerlene Hockey, MD           01/31/2023

## 2023-01-31 NOTE — Telephone Encounter (Signed)
  Chief Complaint: urinary symptoms, right flank pain  Symptoms: right flank pain, burning worse with urination, fatigue, cloudy and dark urine.  Frequency: x couple days  Pertinent Negatives: Patient denies blood in urine, fever  Disposition: [] ED /[] Urgent Care (no appt availability in office) / [x] Appointment(In office/virtual)/ []  [facility]/ [] Home Care/ [] Refused Recommended Disposition /[] [place]/ []  Follow-up with PCP Additional Notes: Patient states she has had kidney infections and UTI before and it feels like this. Patient currently at work, scheduled for appointment today with available provider/location.   Copied from CRM [id number]. Topic: Clinical - Pink Word Triage >> Jan 31, 2023 11:23 AM Isabell A wrote: Reason for Triage: UTI, experiencing back pain, fatigue & feeling very ill. Reason for Disposition  Side (flank) or lower back pain present  Answer Assessment - Initial Assessment Questions 1. SYMPTOM: What's the main symptom you're concerned about? (e.g., frequency, incontinence)     Urinary burning, right flank pain, fatigue.  2. ONSET: When did the  urinary symptoms  start?     X couple of days. 3. PAIN: Is there any pain? If Yes, ask: How bad is it? (Scale: 1-10; mild, moderate, severe)     Right flank pain 5/10; urethra 9/10. 4. CAUSE: What do you think is causing the symptoms?     She thinks she has a UTI or kidney infection, states she has had them in the past and it feels similar.   5. OTHER SYMPTOMS: Do you have any other symptoms? (e.g., blood in urine, fever, flank pain, pain with urination)     Cloudy and dark urine.  6. PREGNANCY: Is there any chance you are pregnant? When was your last menstrual period?     LMP 01/24/23.  Protocols used: Urinary Symptoms-A-AH

## 2023-02-01 LAB — URINE CULTURE
MICRO NUMBER:: 15947849
Result:: NO GROWTH
SPECIMEN QUALITY:: ADEQUATE

## 2023-02-16 ENCOUNTER — Encounter (HOSPITAL_COMMUNITY): Payer: Self-pay

## 2023-02-16 ENCOUNTER — Emergency Department (HOSPITAL_COMMUNITY)
Admission: EM | Admit: 2023-02-16 | Discharge: 2023-02-17 | Disposition: A | Discharge status: Home / Self Care | Payer: 59 | Attending: Emergency Medicine | Admitting: Emergency Medicine

## 2023-02-16 ENCOUNTER — Other Ambulatory Visit: Payer: Self-pay

## 2023-02-16 DIAGNOSIS — Z20822 Contact with and (suspected) exposure to covid-19: Secondary | ICD-10-CM | POA: Diagnosis not present

## 2023-02-16 DIAGNOSIS — J101 Influenza due to other identified influenza virus with other respiratory manifestations: Secondary | ICD-10-CM

## 2023-02-16 DIAGNOSIS — J09X2 Influenza due to identified novel influenza A virus with other respiratory manifestations: Secondary | ICD-10-CM | POA: Insufficient documentation

## 2023-02-16 DIAGNOSIS — R509 Fever, unspecified: Secondary | ICD-10-CM | POA: Diagnosis present

## 2023-02-16 NOTE — ED Triage Notes (Signed)
Says yesterday she began feeling terrible.   Went to UC and was told she may have some virus. Since then pain has intensified in lower chest area.   Productive cough.   Fevers at home up to 103. Treating with 1g tylenol 3 times today. Last dose ~9PM.

## 2023-02-17 ENCOUNTER — Emergency Department (HOSPITAL_COMMUNITY): Payer: 59

## 2023-02-17 LAB — RESP PANEL BY RT-PCR (RSV, FLU A&B, COVID)  RVPGX2
Influenza A by PCR: POSITIVE — AB
Influenza B by PCR: NEGATIVE
Resp Syncytial Virus by PCR: NEGATIVE
SARS Coronavirus 2 by RT PCR: NEGATIVE

## 2023-02-17 MED ORDER — IBUPROFEN 200 MG PO TABS
400.0000 mg | ORAL_TABLET | Freq: Once | ORAL | Status: AC
  Administered 2023-02-17: 400 mg via ORAL
  Filled 2023-02-17: qty 2

## 2023-02-17 MED ORDER — OSELTAMIVIR PHOSPHATE 75 MG PO CAPS: 75.0000 mg | ORAL_CAPSULE | Freq: Two times a day (BID) | ORAL | 0 refills | Status: DC

## 2023-02-17 NOTE — Discharge Instructions (Signed)
Flu test was positive today.  Chest x-ray was normal. Make sure to rest and stay hydrated. Follow-up with your doctor. Return here for new concerns.

## 2023-02-17 NOTE — ED Provider Notes (Signed)
[place] EMERGENCY DEPARTMENT AT [facility] Provider Note   CSN: [id number] Arrival date & time: 02/16/23  2322     History  Chief Complaint  Patient presents with   Fever    Sally Haas is a 28 y.o. female.  The history is provided by the patient and medical records.  Fever  28 year old female presenting to the ED for flulike symptoms.  States this began abruptly yesterday afternoon.  Notably she has had cough, body aches, and generally feeling unwell.  Fever up to 103, has been treating with Tylenol at home.  Seen at urgent care yesterday and flu, COVID, and strep testing were all negative.  Daughter is recently been sick with flulike symptoms as well.  Home Medications Prior to Admission medications   Medication Sig Start Date End Date Taking? Authorizing Provider  oseltamivir (TAMIFLU) 75 MG capsule Take 1 capsule (75 mg total) by mouth every 12 (twelve) hours. 02/17/23  Yes Garlon Hatchet, PA-C      Allergies    Patient has no known allergies.    Review of Systems   Review of Systems  Constitutional:  Positive for fever.  All other systems reviewed and are negative.   Physical Exam Updated Vital Signs BP 125/78 (BP Location: Right Arm)   Pulse (!) 108   Temp 99.1 F (37.3 C) (Oral)   Resp 18   Ht 5\' 7"  (1.702 m)   Wt 83.9 kg   LMP 01/24/2023   SpO2 99%   BMI 28.98 kg/m   Physical Exam Vitals and nursing note reviewed.  Constitutional:      Appearance: She is well-developed.  HENT:     Head: Normocephalic and atraumatic.  Eyes:     Conjunctiva/sclera: Conjunctivae normal.     Pupils: Pupils are equal, round, and reactive to light.  Cardiovascular:     Rate and Rhythm: Normal rate and regular rhythm.     Heart sounds: Normal heart sounds.  Pulmonary:     Effort: Pulmonary effort is normal.     Breath sounds: Normal breath sounds. No wheezing or rhonchi.  Musculoskeletal:        General: Normal range of motion.     Cervical  back: Normal range of motion.  Skin:    General: Skin is warm and dry.  Neurological:     Mental Status: She is alert and oriented to person, place, and time.     ED Results / Procedures / Treatments   Labs (all labs ordered are listed, but only abnormal results are displayed) Labs Reviewed  RESP PANEL BY RT-PCR (RSV, FLU A&B, COVID)  [id number] - Abnormal; Notable for the following components:      Result Value   Influenza A by PCR POSITIVE (*)    All other components within normal limits    EKG EKG Interpretation Date/Time:  Wednesday February 16 2023 23:55:28 EST Ventricular Rate:  134 PR Interval:  147 QRS Duration:  82 QT Interval:  272 QTC Calculation: 406 R Axis:   83  Text Interpretation: Sinus tachycardia l Confirmed by Zadie Rhine ([id number]) on 02/17/2023 12:06:50 AM  Radiology DG Chest Portable 1 View Result Date: 02/17/2023 CLINICAL DATA:  cp, shob EXAM: PORTABLE CHEST 1 VIEW COMPARISON:  Chest x-ray 06/25/2019, CT chest 06/26/2019 FINDINGS: The heart and mediastinal contours are within normal limits. No focal consolidation. No pulmonary edema. No pleural effusion. No pneumothorax. No acute osseous abnormality. IMPRESSION: No active disease. Electronically Signed   By:  Tish Frederickson M.D.   On: 02/17/2023 01:15    Procedures Procedures    Medications Ordered in ED Medications  ibuprofen (ADVIL) tablet 400 mg (400 mg Oral Given 02/17/23 0045)    ED Course/ Medical Decision Making/ A&P                                 Medical Decision Making Amount and/or Complexity of Data Reviewed Radiology: ordered and independent interpretation performed. ECG/medicine tests: ordered and independent interpretation performed.  Risk OTC drugs. Prescription drug management.   28 year old female here with URI symptoms that began yesterday afternoon.  Daughter sick with similar.  Febrile on arrival but defervesced after Motrin here.  She appears to be feeling poorly but is  nontoxic.  Her vitals have significantly improved.  Chest x-ray is clear.  RVP is positive for influenza A.  She is within the window to initiate Tamiflu.  She has cough and nausea medication already from urgent care visit yesterday, can continue this.  Encouraged to rest and hydrate.  Work note given.  Can return here for new concerns.  Final Clinical Impression(s) / ED Diagnoses Final diagnoses:  Influenza A    Rx / DC Orders ED Discharge Orders          Ordered    oseltamivir (TAMIFLU) 75 MG capsule  Every 12 hours        02/17/23 0233              Garlon Hatchet, PA-C 02/17/23 0240    Zadie Rhine, MD 02/17/23 [phone]

## 2023-04-16 ENCOUNTER — Other Ambulatory Visit: Payer: Self-pay

## 2023-04-16 ENCOUNTER — Emergency Department (HOSPITAL_BASED_OUTPATIENT_CLINIC_OR_DEPARTMENT_OTHER)
Admission: EM | Admit: 2023-04-16 | Discharge: 2023-04-17 | Disposition: A | Discharge status: Home / Self Care | Attending: Emergency Medicine | Admitting: Emergency Medicine

## 2023-04-16 DIAGNOSIS — R5381 Other malaise: Secondary | ICD-10-CM

## 2023-04-16 DIAGNOSIS — R0789 Other chest pain: Secondary | ICD-10-CM | POA: Diagnosis present

## 2023-04-16 DIAGNOSIS — R079 Chest pain, unspecified: Secondary | ICD-10-CM

## 2023-04-16 DIAGNOSIS — M25471 Effusion, right ankle: Secondary | ICD-10-CM | POA: Diagnosis not present

## 2023-04-16 LAB — CBC
HCT: 36.6 % (ref 36.0–46.0)
Hemoglobin: 12.5 g/dL (ref 12.0–15.0)
MCH: 29.6 pg (ref 26.0–34.0)
MCHC: 34.2 g/dL (ref 30.0–36.0)
MCV: 86.7 fL (ref 80.0–100.0)
Platelets: 124 10*3/uL — ABNORMAL LOW (ref 150–400)
RBC: 4.22 MIL/uL (ref 3.87–5.11)
RDW: 12.4 % (ref 11.5–15.5)
WBC: 5.5 10*3/uL (ref 4.0–10.5)
nRBC: 0 % (ref 0.0–0.2)

## 2023-04-16 LAB — PREGNANCY, URINE: Preg Test, Ur: NEGATIVE

## 2023-04-16 NOTE — ED Triage Notes (Addendum)
 Has been feeling 'off'all day. Multiple complaints- right swollen ankle, painful, palpitations, chest pressure, feels like abdomen is swollen, 5lbs weight gain over 24hr. Recent tattoo to right calf- appears free of obvious infection.   Reports some sort of arrhythmia in past- possibly 2nd heart block.

## 2023-04-17 ENCOUNTER — Emergency Department (HOSPITAL_BASED_OUTPATIENT_CLINIC_OR_DEPARTMENT_OTHER)

## 2023-04-17 LAB — URINALYSIS, ROUTINE W REFLEX MICROSCOPIC
Bilirubin Urine: NEGATIVE
Glucose, UA: NEGATIVE mg/dL
Hgb urine dipstick: NEGATIVE
Ketones, ur: NEGATIVE mg/dL
Nitrite: NEGATIVE
Protein, ur: NEGATIVE mg/dL
Specific Gravity, Urine: 1.024 (ref 1.005–1.030)
pH: 6.5 (ref 5.0–8.0)

## 2023-04-17 LAB — BASIC METABOLIC PANEL WITH GFR
Anion gap: 9 (ref 5–15)
BUN: 15 mg/dL (ref 6–20)
CO2: 22 mmol/L (ref 22–32)
Calcium: 8.8 mg/dL — ABNORMAL LOW (ref 8.9–10.3)
Chloride: 109 mmol/L (ref 98–111)
Creatinine, Ser: 0.55 mg/dL (ref 0.44–1.00)
GFR, Estimated: >60 mL/min (ref >60)
Glucose, Bld: 125 mg/dL — ABNORMAL HIGH (ref 70–99)
Potassium: 3.4 mmol/L — ABNORMAL LOW (ref 3.5–5.1)
Sodium: 140 mmol/L (ref 135–145)

## 2023-04-17 LAB — TROPONIN I (HIGH SENSITIVITY)
Troponin I (High Sensitivity): <2 ng/L (ref <18)
Troponin I (High Sensitivity): <2 ng/L (ref <18)

## 2023-04-17 LAB — HEPATIC FUNCTION PANEL
ALT: 19 U/L (ref 0–44)
AST: 23 U/L (ref 15–41)
Albumin: 4.5 g/dL (ref 3.5–5.0)
Alkaline Phosphatase: 41 U/L (ref 38–126)
Bilirubin, Direct: 0.1 mg/dL (ref 0.0–0.2)
Indirect Bilirubin: 0.3 mg/dL (ref 0.3–0.9)
Total Bilirubin: 0.4 mg/dL (ref 0.0–1.2)
Total Protein: 7 g/dL (ref 6.5–8.1)

## 2023-04-17 LAB — BRAIN NATRIURETIC PEPTIDE: B Natriuretic Peptide: 86.6 pg/mL (ref 0.0–100.0)

## 2023-04-17 MED ORDER — CEPHALEXIN 250 MG PO CAPS
500.0000 mg | ORAL_CAPSULE | Freq: Once | ORAL | Status: AC
  Administered 2023-04-17: 500 mg via ORAL
  Filled 2023-04-17: qty 2

## 2023-04-17 MED ORDER — CEPHALEXIN 500 MG PO CAPS: 500.0000 mg | ORAL_CAPSULE | Freq: Three times a day (TID) | ORAL | 0 refills | Status: AC

## 2023-04-17 NOTE — ED Provider Notes (Signed)
 [facility]ERGENCY [facility] Emergency Department Provider Note MRN:  [id number]  Arrival date & time: 04/17/23     Chief Complaint   Malaise History of Present Illness   Sally Haas is a 28 y.o. year-old female with no pertinent past medical history presenting to the ED with chief complaint of malaise.  General feeling of malaise today.  Noticed her right ankle was swollen.  Feels bloated in the abdomen, endorsing 5 pound weight gain for no reason recently.  Some chest discomfort, no shortness of breath.  Review of Systems  A thorough review of systems was obtained and all systems are negative except as noted in the HPI and PMH.   Patient's Health History    Past Medical History:  Diagnosis Date   Bipolar disease, chronic (HCC)    Ectopic pregnancy    Kidney stone    Thrombocytopenia [facility])     Past Surgical History:  Procedure Laterality Date   DIAGNOSTIC LAPAROSCOPY WITH REMOVAL OF ECTOPIC PREGNANCY Right 05/07/2019   Procedure: Laparoscopic Removal Of Ectopic Pregnancy;  Surgeon: Reva Bores, MD;  Location: [facility] OR;  Service: Gynecology;  Laterality: Right;   LAPAROSCOPIC CHOLECYSTECTOMY     TUBAL LIGATION  03/2020   UPPER GASTROINTESTINAL ENDOSCOPY      Family History  Problem Relation Age of Onset   Cancer Mother    Alcohol abuse Father    Diabetes Paternal Aunt    Cancer Maternal Grandmother    Colon cancer Paternal Grandmother    Cancer Paternal Grandmother    Colon cancer Paternal Grandfather    Cancer Paternal Grandfather    Stomach cancer Neg Hx    Esophageal cancer Neg Hx     Social History   Socioeconomic History   Marital status: Married    Spouse name: Not on file   Number of children: 2   Years of education: Not on file   Highest education level: 12th grade  Occupational History   Occupation: Art therapist  Tobacco Use   Smoking status: Never   Smokeless tobacco: Never  Vaping Use   Vaping status: Never Used  Substance and  Sexual Activity   Alcohol use: Not Currently   Drug use: Never   Sexual activity: Yes    Birth control/protection: Surgical, None  Other Topics Concern   Not on file  Social History Narrative   Student and part time work-business.  Working [place].     Social Drivers of Corporate investment banker Strain: Low Risk  (11/15/2022)   Overall Financial Resource Strain (CARDIA)    Difficulty of Paying Living Expenses: Not hard at all  Food Insecurity: No Food Insecurity (11/15/2022)   Hunger Vital Sign    Worried About Running Out of Food in the Last Year: Never true    Ran Out of Food in the Last Year: Never true  Transportation Needs: No Transportation Needs (11/15/2022)   PRAPARE - Administrator, Civil Service (Medical): No    Lack of Transportation (Non-Medical): No  Physical Activity: Sufficiently Active (11/15/2022)   Exercise Vital Sign    Days of Exercise per Week: 5 days    Minutes of Exercise per Session: 90 min  Stress: No Stress Concern Present (11/15/2022)   [employer] of Occupational Health - Occupational Stress Questionnaire    Feeling of Stress : Not at all  Social Connections: Moderately Isolated (11/15/2022)   Social Connection and Isolation Panel [NHANES]    Frequency of Communication with  Friends and Family: More than three times a week    Frequency of Social Gatherings with Friends and Family: Once a week    Attends Religious Services: Never    Database administrator or Organizations: No    Attends Engineer, structural: Not on file    Marital Status: Married  Catering manager Violence: Not At Risk (07/16/2019)   Humiliation, Afraid, Rape, and Kick questionnaire    Fear of Current or Ex-Partner: No    Emotionally Abused: No    Physically Abused: No    Sexually Abused: No     Physical Exam   Vitals:   04/16/23 2345 04/17/23 0000  BP: 127/76 118/79  Pulse: 73 67  Resp: 17 19  Temp:    SpO2: 100% 100%    CONSTITUTIONAL:  Well-appearing, NAD NEURO/PSYCH:  Alert and oriented x 3, no focal deficits EYES:  eyes equal and reactive ENT/NECK:  no LAD, no JVD CARDIO: Regular rate, well-perfused, normal S1 and S2 PULM:  CTAB no wheezing or rhonchi GI/GU:  non-distended, non-tender MSK/SPINE:  No gross deformities, no edema SKIN:  no rash, atraumatic   *Additional and/or pertinent findings included in MDM below  Diagnostic and Interventional Summary    EKG Interpretation Date/Time:  Saturday April 16 2023 23:20:58 EDT Ventricular Rate:  75 PR Interval:  158 QRS Duration:  88 QT Interval:  366 QTC Calculation: 408 R Axis:   75  Text Interpretation: Normal sinus rhythm with sinus arrhythmia Normal ECG When compared with ECG of 16-Feb-2023 23:55, PREVIOUS ECG IS PRESENT Confirmed by Kennis Carina [phone]) on 04/17/2023 12:44:19 AM       Labs Reviewed  BASIC METABOLIC PANEL WITH GFR - Abnormal; Notable for the following components:      Result Value   Potassium 3.4 (*)    Glucose, Bld 125 (*)    Calcium 8.8 (*)    All other components within normal limits  CBC - Abnormal; Notable for the following components:   Platelets 124 (*)    All other components within normal limits  URINALYSIS, ROUTINE W REFLEX MICROSCOPIC - Abnormal; Notable for the following components:   Leukocytes,Ua TRACE (*)    Bacteria, UA RARE (*)    All other components within normal limits  PREGNANCY, URINE  BRAIN NATRIURETIC PEPTIDE  HEPATIC FUNCTION PANEL  TROPONIN I (HIGH SENSITIVITY)  TROPONIN I (HIGH SENSITIVITY)    DG Chest Port 1 View  Final Result      Medications  cephALEXin (KEFLEX) capsule 500 mg (has no administration in time range)     Procedures  /  Critical Care Procedures  ED Course and Medical Decision Making  Initial Impression and Ddx Multiple complaints this evening.  Normal vital signs, well-appearing in no acute distress.  Minimal swelling to the left malleolus on exam, no calf pain or swelling or  tenderness, highly doubt DVT.  Unclear cause of patient's somewhat vague symptoms.  With chest pain obtaining screening EKG, troponin, chest x-ray.  With concern for water retention we will check renal, liver function, BNP, chest x-ray.  Past medical/surgical history that increases complexity of ED encounter: None  Interpretation of Diagnostics I personally reviewed the EKG and my interpretation is as follows: Sinus rhythm  No significant blood count or electrolyte disturbance.  Troponin negative  Patient Reassessment and Ultimate Disposition/Management     Closer evaluation of patient's new tattoo reveals that there is some mild erythema bordering the new tattoo which is on the lateral  right lower leg.  There is some increased warmth to the tattoo as well.  Will treat for possible cellulitis, patient is appropriate for discharge with return precautions.  Patient management required discussion with the following services or consulting groups:  None  Complexity of Problems Addressed Acute illness or injury that poses threat of life of bodily function  Additional Data Reviewed and Analyzed Further history obtained from: Further history from spouse/family member  Additional Factors Impacting ED Encounter Risk Prescriptions  Elmer Sow. Pilar Plate, MD [facility] Emergency Medicine [facility] [email or web address]  Final Clinical Impressions(s) / ED Diagnoses     ICD-10-CM   1. Malaise  R53.81     2. Right ankle swelling  M25.471     3. Chest pain, unspecified type  R07.9       ED Discharge Orders          Ordered    cephALEXin (KEFLEX) 500 MG capsule  3 times daily        04/17/23 0220             Discharge Instructions Discussed with and Provided to Patient:     Discharge Instructions      You were evaluated in the Emergency Department and after careful evaluation, we did not find any emergent condition requiring admission or further testing in the  hospital.  Your exam/testing today is overall reassuring.  We are treating you for a possible cellulitis or skin infection related to your new tattoo.  Take the Keflex antibiotic as directed, can use Tylenol or Motrin for discomfort.  Please return to the Emergency Department if you experience any worsening of your condition.   Thank you for allowing [place] to be a part of your care.       Sabas Sous, MD 04/17/23 [phone]

## 2023-04-17 NOTE — Discharge Instructions (Addendum)
 You were evaluated in the Emergency Department and after careful evaluation, we did not find any emergent condition requiring admission or further testing in the hospital.  Your exam/testing today is overall reassuring.  We are treating you for a possible cellulitis or skin infection related to your new tattoo.  Take the Keflex antibiotic as directed, can use Tylenol or Motrin for discomfort.  Please return to the Emergency Department if you experience any worsening of your condition.   Thank you for allowing [place] to be a part of your care.

## 2023-04-17 NOTE — ED Notes (Signed)
 RN reviewed discharge instructions with pt. Pt verbalized understanding and had no further questions. VSS upon discharge.

## 2023-05-17 ENCOUNTER — Telehealth: Admitting: Family Medicine

## 2023-05-17 DIAGNOSIS — R21 Rash and other nonspecific skin eruption: Secondary | ICD-10-CM | POA: Diagnosis not present

## 2023-05-17 MED ORDER — TRIAMCINOLONE ACETONIDE 0.025 % EX OINT: 1.0000 | TOPICAL_OINTMENT | Freq: Two times a day (BID) | CUTANEOUS | 0 refills | Status: DC

## 2023-05-17 NOTE — Progress Notes (Signed)
 E Visit for Rash  We are sorry that you are not feeling well. Here is how we plan to help!  Based on what you shared with me it looks like you have contact dermatitis.  Contact dermatitis is a skin rash caused by something that touches the skin and causes irritation or inflammation.  Your skin may be red, swollen, dry, cracked, and itch.  The rash should go away in a few days but can last a few weeks.  If you get a rash, it's important to figure out what caused it so the irritant can be avoided in the future. I will order you a stronger ointment for your hands-steroid, if not improving follow up in person- you might need something oral if the topical RX does not work. I have ordered you Kenalog to use twice daily.    HOME CARE:  Take cool showers and avoid direct sunlight. Apply cool compress or wet dressings. Take a bath in an oatmeal bath.  Sprinkle content of one Aveeno packet under running faucet with comfortably warm water.  Bathe for 15-20 minutes, 1-2 times daily.  Pat dry with a towel. Do not rub the rash. Use hydrocortisone cream. Take an antihistamine like Benadryl for widespread rashes that itch.  The adult dose of Benadryl is 25-50 mg by mouth 4 times daily. Caution:  This type of medication may cause sleepiness.  Do not drink alcohol, drive, or operate dangerous machinery while taking antihistamines.  Do not take these medications if you have prostate enlargement.  Read package instructions thoroughly on all medications that you take.  GET HELP RIGHT AWAY IF:  Symptoms don't go away after treatment. Severe itching that persists. If you rash spreads or swells. If you rash begins to smell. If it blisters and opens or develops a yellow-brown crust. You develop a fever. You have a sore throat. You become short of breath.  MAKE SURE YOU:  Understand these instructions. Will watch your condition. Will get help right away if you are not doing well or get worse.  Thank you for  choosing an e-visit.  Your e-visit answers were reviewed by a board certified advanced clinical practitioner to complete your personal care plan. Depending upon the condition, your plan could have included both over the counter or prescription medications.  Please review your pharmacy choice. Make sure the pharmacy is open so you can pick up prescription now. If there is a problem, you may contact your provider through [employer] and have the prescription routed to another pharmacy.  Your safety is important to us . If you have drug allergies check your prescription carefully.   For the next 24 hours you can use MyChart to ask questions about today's visit, request a non-urgent call back, or ask for a work or school excuse. You will get an email in the next two days asking about your experience. I hope that your e-visit has been valuable and will speed your recovery.  I provided 5 minutes of non face-to-face time during this encounter for chart review, medication and order placement, as well as and documentation.

## 2023-09-14 ENCOUNTER — Ambulatory Visit: Payer: Self-pay | Admitting: Family Medicine

## 2023-09-26 ENCOUNTER — Encounter: Payer: Self-pay | Admitting: Family Medicine

## 2023-09-26 ENCOUNTER — Ambulatory Visit (INDEPENDENT_AMBULATORY_CARE_PROVIDER_SITE_OTHER): Payer: Self-pay | Admitting: Family Medicine

## 2023-09-26 ENCOUNTER — Other Ambulatory Visit: Payer: Self-pay | Admitting: Medical Genetics

## 2023-09-26 VITALS — BP 122/78 | HR 78 | Temp 98.6°F | Resp 18 | Ht 67.0 in | Wt 196.4 lb

## 2023-09-26 DIAGNOSIS — N6310 Unspecified lump in the right breast, unspecified quadrant: Secondary | ICD-10-CM | POA: Diagnosis not present

## 2023-09-26 DIAGNOSIS — Z803 Family history of malignant neoplasm of breast: Secondary | ICD-10-CM

## 2023-09-26 NOTE — Progress Notes (Signed)
 Subjective:     Patient ID: Sally Haas, female    DOB: December 24, 1995, 28 y.o.   MRN: [id number]  Chief Complaint  Patient presents with   Breast Mass    Lump in right breast, first noticed about 1 month ago Some discharge, but as nipple piercing, have some breast pain off and on     HPI Discussed the use of AI scribe software for clinical note transcription with the patient, who gave verbal consent to proceed.  History of Present Illness Sally Haas is a 28 year old female who presents with a lump in her right breast.  She discovered a lump in her right breast approximately one month ago, initially noticing pain in the area. Her husband also felt the lump and advised her to seek medical evaluation. She initially attributed the lump to a nipple piercing she had several months ago. She scheduled an appointment two weeks ago but had to reschedule to today.  There is a small amount of discharge from the nipple, which she notes is coming through the piercing rather than the nipple itself. Her husband believes the lump has increased in size, but she is uncertain due to frequent palpation. She denies any recent breast trauma.  Her maternal grandmother and great aunt had breast cancer in their 25's, and her grandmother underwent a mastectomy. Her paternal grandfather had colon cancer, and her paternal grandmother had lung cancer.  She has no history of smoking, drug use, or regular alcohol consumption, though she drinks socially about once a month. She works at [employer] and has had a tubal ligation. Her last menstrual period was on September 13, 2023.    Health Maintenance Due  Topic Date Due   Hepatitis B Vaccines 19-59 Average Risk (1 of 3 - 19+ 3-dose series) Never done   HPV VACCINES (2 - 3-dose series) 08/05/2021   Cervical Cancer Screening (Pap smear)  07/24/2022   Influenza Vaccine  08/19/2023    Past Medical History:  Diagnosis Date   Bipolar disease, chronic (HCC)     Ectopic pregnancy    Kidney stone    Thrombocytopenia (HCC)     Past Surgical History:  Procedure Laterality Date   DIAGNOSTIC LAPAROSCOPY WITH REMOVAL OF ECTOPIC PREGNANCY Right 05/07/2019   Procedure: Laparoscopic Removal Of Ectopic Pregnancy;  Surgeon: Fredirick Glenys RAMAN, MD;  Location: [facility] OR;  Service: Gynecology;  Laterality: Right;   LAPAROSCOPIC CHOLECYSTECTOMY     TUBAL LIGATION  03/2020   UPPER GASTROINTESTINAL ENDOSCOPY      No current outpatient medications on file.  No Known Allergies ROS neg/noncontributory except as noted HPI/below      Objective:     BP 122/78   Pulse 78   Temp 98.6 F (37 C) (Temporal)   Resp 18   Ht 5' 7 (1.702 m)   Wt 196 lb 6 oz (89.1 kg)   LMP 09/13/2023 (Exact Date)   SpO2 99%   BMI 30.76 kg/m  Wt Readings from Last 3 Encounters:  09/26/23 196 lb 6 oz (89.1 kg)  02/16/23 185 lb (83.9 kg)  01/31/23 194 lb 3.2 oz (88.1 kg)    Physical Exam Chest:       Comments: Mobile, sl tender, rubbery lump above R nipple.  Fullness/FC on R lateral.  No nipple d/c.  + piercing   No ax nodes.  L breast fine  Gen: WDWN NAD HEENT: NCAT, conjunctiva not injected, sclera nonicteric EXT:  no edema MSK: no gross  abnormalities.  NEURO: A&O x3.  CN II-XII intact.  PSYCH: normal mood. Good eye contact     Assessment & Plan:  Mass of right breast, unspecified quadrant -     US  BREAST COMPLETE UNI LEFT INC AXILLA; Future -     US  BREAST COMPLETE UNI RIGHT INC AXILLA; Future -     MM 3D DIAGNOSTIC MAMMOGRAM BILATERAL BREAST; Future  Family history of breast cancer -     US  BREAST COMPLETE UNI LEFT INC AXILLA; Future -     US  BREAST COMPLETE UNI RIGHT INC AXILLA; Future -     MM 3D DIAGNOSTIC MAMMOGRAM BILATERAL BREAST; Future  Assessment and Plan Assessment & Plan Right breast lump   A right breast lump was identified about a month ago, with associated pain. She has a nipple piercing, but the lump's appearance does not align with the timing  of the piercing. There is slight discharge from the piercing site, but no fever or significant changes in the lump's size or pain level. The lump is fibrous, not typical of an abscess or cancer. Differential diagnosis includes fibrocystic changes or other benign breast conditions. No immediate concern for infection from the piercing, but further evaluation is necessary. Order a mammogram and ultrasound of the right breast to assess the lump. Advise her to refrain from manipulating the lump to prevent inflammation. Instruct her to schedule the imaging studies and follow up with results.    Return if symptoms worsen or fail to improve, for sch cpe.  Jenkins CHRISTELLA Carrel, MD

## 2023-09-26 NOTE — Patient Instructions (Signed)
[phone] for mammogram, [phone] other studies

## 2023-10-03 ENCOUNTER — Ambulatory Visit
Admission: RE | Admit: 2023-10-03 | Discharge: 2023-10-03 | Disposition: A | Discharge status: Home / Self Care | Source: Ambulatory Visit | Attending: Family Medicine | Admitting: Family Medicine

## 2023-10-03 DIAGNOSIS — Z803 Family history of malignant neoplasm of breast: Secondary | ICD-10-CM

## 2023-10-03 DIAGNOSIS — N6310 Unspecified lump in the right breast, unspecified quadrant: Secondary | ICD-10-CM

## 2023-10-03 DIAGNOSIS — N6489 Other specified disorders of breast: Secondary | ICD-10-CM | POA: Diagnosis not present

## 2023-10-26 ENCOUNTER — Encounter: Payer: BC Managed Care – PPO | Admitting: Family Medicine

## 2023-10-27 ENCOUNTER — Other Ambulatory Visit: Payer: Self-pay

## 2023-10-27 ENCOUNTER — Ambulatory Visit
Admission: RE | Admit: 2023-10-27 | Discharge: 2023-10-27 | Disposition: A | Discharge status: Home / Self Care | Attending: Physician Assistant | Admitting: Physician Assistant

## 2023-10-27 VITALS — BP 131/81 | HR 84 | Temp 98.3°F | Resp 18 | Ht 66.0 in | Wt 190.0 lb

## 2023-10-27 DIAGNOSIS — R0789 Other chest pain: Secondary | ICD-10-CM | POA: Diagnosis not present

## 2023-10-27 DIAGNOSIS — R42 Dizziness and giddiness: Secondary | ICD-10-CM

## 2023-10-27 DIAGNOSIS — R0989 Other specified symptoms and signs involving the circulatory and respiratory systems: Secondary | ICD-10-CM | POA: Diagnosis not present

## 2023-10-27 LAB — POC COVID19/FLU A&B COMBO
Covid Antigen, POC: NEGATIVE
Influenza A Antigen, POC: NEGATIVE
Influenza B Antigen, POC: NEGATIVE

## 2023-10-27 MED ORDER — MECLIZINE HCL 12.5 MG PO TABS: 12.5000 mg | ORAL_TABLET | Freq: Three times a day (TID) | ORAL | 0 refills | Status: DC | PRN

## 2023-10-27 MED ORDER — ALBUTEROL SULFATE HFA 108 (90 BASE) MCG/ACT IN AERS: 1.0000 | INHALATION_SPRAY | Freq: Four times a day (QID) | RESPIRATORY_TRACT | 0 refills | Status: DC | PRN

## 2023-10-27 MED ORDER — AEROCHAMBER PLUS FLO-VU MEDIUM MISC
1.0000 | Freq: Once | Status: AC
  Administered 2023-10-27: 1

## 2023-10-27 NOTE — Discharge Instructions (Addendum)
 VISIT SUMMARY:  You came in today with chest pain, shortness of breath, and lightheadedness. You also have symptoms of an upper respiratory infection, including nasal congestion, runny nose, sore throat, and a non-productive cough. We discussed your past medical history of a second-degree heart block during your pregnancy, which resolved after delivery.  YOUR PLAN:  -ACUTE UPPER RESPIRATORY INFECTION WITH CHEST PAIN AND DIZZINESS: An acute upper respiratory infection is a viral infection that affects your nose, throat, and airways. Your chest pain and dizziness are likely due to this infection. We will perform an EKG to rule out any heart-related causes of your chest pain. In the meantime, you can use Flonase and Mucinex to help with nasal congestion, and take meclizine as needed for dizziness. A work note has been provided for you to return to work on Saturday. I am also sending in an albuterol rescue inhaler to use to help with your chest tightness and shortness of breath.  You can use this up to every 6 hours as needed for coughing and shortness of breath.  If your chest pain and tightness gets worse, you experience increased shortness of breath or difficulty breathing, fevers that are not responding to Tylenol  and ibuprofen , nausea and vomiting that prevents you from staying hydrated please go to the emergency room as these could be signs of a medical emergency.

## 2023-10-27 NOTE — ED Triage Notes (Signed)
 Pt presents with complaints of chest pain and dizziness. States her symptoms began yesterday but became more noticeable today while up at work. Currently rates overall chest pain a 6/10. Describes as dull + tight. Does endorse nasal congestion and sinus pain. OTC Dayquil taken with some relief. Although no improvement with the pain in chest.

## 2023-10-27 NOTE — ED Provider Notes (Signed)
 [facility]    CSN: [id number] Arrival date & time: 10/27/23  1436      History   Chief Complaint Chief Complaint  Patient presents with   Chest Pain   Dizziness    HPI Sally Haas is a 28 y.o. female.  has a past medical history of Bipolar disease, chronic (HCC), Ectopic pregnancy, Kidney stone, and Thrombocytopenia.   HPI  Discussed the use of AI scribe software for clinical note transcription with the patient, who gave verbal consent to proceed. The patient presents with chest pain, shortness of breath, and lightheadedness.  She experiences chest pain described as a tight, squishing sensation located in the middle of the chest, exacerbated by deep breaths and rapid movements, and associated with nausea. She has been taking DayQuil without relief.  Shortness of breath occurs particularly with rapid movements or deep breaths, and she feels very tired when walking fast or getting up quickly.  Lightheadedness and dizziness are noted, especially with movement, but standing still alleviates these symptoms.  She has a non-productive cough triggered by laughing, along with nasal congestion, rhinorrhea, sore throat, and sinus pain described as burning. She reported a little bit of runny nose after the COVID swab. No fever, chills, ear pain, vomiting, or diarrhea are reported.  Her past medical history includes episodes of elevated heart rate during her second pregnancy, leading to hospitalization. Multiple EKGs and heart monitoring revealed a slight heart block type II and unburdened T waves, which resolved post-pregnancy without treatment.  She works at [employer] and has been off work for the past two days due to her symptoms, impacting her ability to perform her job duties.    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   Supervision of low-risk pregnancy 07/16/2019   Depression affecting pregnancy 07/16/2019   Rh negative state in antepartum period 05/07/2019   Abdominal pain in pregnancy 05/06/2019   Pregnancy of unknown anatomic location 05/06/2019   Thrombocytopenia 05/06/2019    Past Surgical History:  Procedure Laterality Date   DIAGNOSTIC LAPAROSCOPY WITH REMOVAL OF ECTOPIC PREGNANCY Right 05/07/2019   Procedure: Laparoscopic Removal Of Ectopic Pregnancy;  Surgeon: Fredirick Glenys RAMAN, MD;  Location: [facility];  Service: Gynecology;  Laterality: Right;   LAPAROSCOPIC CHOLECYSTECTOMY     TUBAL LIGATION  03/2020   UPPER GASTROINTESTINAL ENDOSCOPY      OB History     Gravida  4   Para  1   Term  1   Preterm      AB  2   Living  1      SAB  1   IAB      Ectopic  1   Multiple      Live Births  1        Obstetric Comments  G1: early SAB G2: TSVD 2019           Home Medications    Prior to Admission medications   Medication Sig Start Date End Date Taking? Authorizing Provider  albuterol (VENTOLIN HFA) 108 (90 Base) MCG/ACT inhaler Inhale 1-2 puffs into the lungs every 6 (six) hours as needed for wheezing or shortness of breath. 10/27/23  Yes Sincerity Cedar E,  PA-C  meclizine (ANTIVERT) 12.5 MG tablet Take 1 tablet (12.5 mg total) by mouth 3 (three) times daily as needed for dizziness. 10/27/23  Yes Isair Inabinet, Rocky BRAVO, PA-C    Family History Family History  Problem Relation Age of Onset   Cancer Mother        skin   Alcohol abuse Father    Diabetes Paternal Aunt    Cancer Maternal Grandmother 22       breast   Cancer Paternal Grandmother        lung   Colon cancer Paternal Grandfather    Cancer Paternal Grandfather 80 - 20       colon   Cancer Other        breast   Stomach cancer Neg Hx    Esophageal cancer Neg Hx     Social History Social History    Tobacco Use   Smoking status: Never   Smokeless tobacco: Never  Vaping Use   Vaping status: Never Used  Substance Use Topics   Alcohol use: Not Currently   Drug use: Never     Allergies   Patient has no known allergies.   Review of Systems Review of Systems  Constitutional:  Negative for chills and fever.  HENT:  Positive for congestion, postnasal drip, rhinorrhea and sore throat. Negative for ear pain.   Respiratory:  Positive for cough, chest tightness and shortness of breath.   Cardiovascular:  Positive for chest pain.  Gastrointestinal:  Positive for nausea. Negative for diarrhea and vomiting.  Neurological:  Positive for light-headedness.     Physical Exam Triage Vital Signs ED Triage Vitals  Encounter Vitals Group     BP 10/27/23 1443 131/81     Girls Systolic BP Percentile --      Girls Diastolic BP Percentile --      Boys Systolic BP Percentile --      Boys Diastolic BP Percentile --      Pulse Rate 10/27/23 1443 84     Resp 10/27/23 1443 18     Temp 10/27/23 1443 98.3 F (36.8 C)     Temp Source 10/27/23 1443 Oral     SpO2 10/27/23 1443 98 %     Weight 10/27/23 1443 190 lb (86.2 kg)     Height 10/27/23 1443 5' 6 (1.676 m)     Head Circumference --      Peak Flow --      Pain Score 10/27/23 1502 6     Pain Loc --      Pain Education --      Exclude from Growth Chart --    No data found.  Updated Vital Signs BP 131/81 (BP Location: Right Arm)   Pulse 84   Temp 98.3 F (36.8 C) (Oral)   Resp 18   Ht 5' 6 (1.676 m)   Wt 190 lb (86.2 kg)   LMP 10/07/2023 (Approximate)   SpO2 98%   BMI 30.67 kg/m   Visual Acuity Right Eye Distance:   Left Eye Distance:   Bilateral Distance:    Right Eye Near:   Left Eye Near:    Bilateral Near:     Physical Exam Vitals reviewed.  Constitutional:      General: She is awake.     Appearance: Normal appearance. She is well-developed and well-groomed.  HENT:     Head: Normocephalic and atraumatic.      Right Ear: Hearing, tympanic membrane and ear canal normal.  Left Ear: Hearing, tympanic membrane and ear canal normal.     Mouth/Throat:     Lips: Pink.     Mouth: Mucous membranes are moist.     Pharynx: Oropharynx is clear. Uvula midline. No pharyngeal swelling, oropharyngeal exudate, posterior oropharyngeal erythema, uvula swelling or postnasal drip.  Cardiovascular:     Rate and Rhythm: Normal rate and regular rhythm.     Pulses: Normal pulses.          Radial pulses are 2+ on the right side and 2+ on the left side.     Heart sounds: Normal heart sounds. No murmur heard.    No friction rub. No gallop.  Pulmonary:     Effort: Pulmonary effort is normal.     Breath sounds: Normal breath sounds. No decreased air movement. No decreased breath sounds, wheezing, rhonchi or rales.  Musculoskeletal:     Cervical back: Normal range of motion and neck supple.  Lymphadenopathy:     Head:     Right side of head: No submental, submandibular or preauricular adenopathy.     Left side of head: No submental, submandibular or preauricular adenopathy.     Cervical:     Right cervical: No superficial cervical adenopathy.    Left cervical: No superficial cervical adenopathy.     Upper Body:     Right upper body: No supraclavicular adenopathy.     Left upper body: No supraclavicular adenopathy.  Neurological:     Mental Status: She is alert.  Psychiatric:        Behavior: Behavior is cooperative.      UC Treatments / Results  Labs (all labs ordered are listed, but only abnormal results are displayed) Labs Reviewed  POC COVID19/FLU A&B COMBO - Normal    EKG   Radiology No results found.  Procedures ED EKG  Date/Time: 10/27/2023 4:13 PM  Performed by: Marylene Rocky BRAVO, PA-C Authorized by: Marylene Rocky BRAVO, PA-C   Previous ECG:    Previous ECG:  Compared to current   Comparison ECG info:  04/16/23 Interpretation:    Interpretation: normal   Rate:    ECG rate:  98   ECG rate  assessment: normal   Rhythm:    Rhythm: sinus rhythm   ST segments:    ST segments:  Non-specific   Details:  Potential slight depression in leads 1 and 2 but not reciprocal T waves:    T waves: non-specific    (including critical care time)  Medications Ordered in UC Medications  AeroChamber Plus Flo-Vu Medium MISC 1 each (has no administration in time range)    Initial Impression / Assessment and Plan / UC Course  I have reviewed the triage vital signs and the nursing notes.  Pertinent labs & imaging results that were available during my care of the patient were reviewed by me and considered in my medical decision making (see chart for details).      Final Clinical Impressions(s) / UC Diagnoses   Final diagnoses:  Symptoms of upper respiratory infection (URI)  Feeling of chest tightness  Dizziness   Acute upper respiratory infection with chest pain and dizziness Presents with symptoms consistent with an acute upper respiratory infection, including nasal congestion, runny nose, sore throat, and a non-productive cough. Reports chest pain described as a tight, squishing sensation in the middle of the chest, exacerbated by deep breaths and physical activity, accompanied by nausea and dizziness. Dizziness likely due to congestion affecting the inner ear. No  fever, chills, or productive cough. Chest pain and dizziness likely secondary to the upper respiratory infection, but an EKG will be performed to rule out cardiac causes. - Order EKG to rule out cardiac causes of chest pain. - Recommend Flonase and Mucinex to alleviate nasal congestion. - Prescribe meclizine for dizziness as needed. Will send albuterol rescue inhaler to assist with chest tightness and shortness of breath.  Reviewed administration of albuterol rescue inhaler and will send patient home with a spacer to further assist with administration. - Provide a work note for return on Saturday.  Second-degree heart block?/  Chest pain and tightness Second-degree heart block identified during a previous pregnancy, with episodes of elevated heart rate. Postpartum, these episodes resolved, and no treatment was initiated. Current symptoms are not typical of heart block, but an EKG will be performed to ensure no cardiac involvement in her current presentation. - Order EKG to assess current cardiac status.  EKG largely reassuring. At this time I suspect chest tightness is secondary to costochondritis and chest muscle pain versus cardiac etiology.  Reviewed signs and symptoms requiring emergency room evaluation.  Patient voiced agreement understanding with recommendations.    Discharge Instructions      VISIT SUMMARY:  You came in today with chest pain, shortness of breath, and lightheadedness. You also have symptoms of an upper respiratory infection, including nasal congestion, runny nose, sore throat, and a non-productive cough. We discussed your past medical history of a second-degree heart block during your pregnancy, which resolved after delivery.  YOUR PLAN:  -ACUTE UPPER RESPIRATORY INFECTION WITH CHEST PAIN AND DIZZINESS: An acute upper respiratory infection is a viral infection that affects your nose, throat, and airways. Your chest pain and dizziness are likely due to this infection. We will perform an EKG to rule out any heart-related causes of your chest pain. In the meantime, you can use Flonase and Mucinex to help with nasal congestion, and take meclizine as needed for dizziness. A work note has been provided for you to return to work on Saturday. I am also sending in an albuterol rescue inhaler to use to help with your chest tightness and shortness of breath.  You can use this up to every 6 hours as needed for coughing and shortness of breath.  If your chest pain and tightness gets worse, you experience increased shortness of breath or difficulty breathing, fevers that are not responding to Tylenol  and  ibuprofen , nausea and vomiting that prevents you from staying hydrated please go to the emergency room as these could be signs of a medical emergency.     ED Prescriptions     Medication Sig Dispense Auth. Provider   meclizine (ANTIVERT) 12.5 MG tablet Take 1 tablet (12.5 mg total) by mouth 3 (three) times daily as needed for dizziness. 30 tablet Butler Vegh E, PA-C   albuterol (VENTOLIN HFA) 108 (90 Base) MCG/ACT inhaler Inhale 1-2 puffs into the lungs every 6 (six) hours as needed for wheezing or shortness of breath. 8 g Yamil Dougher E, PA-C      PDMP not reviewed this encounter.   Marylene Rocky BRAVO, PA-C 10/27/23 1713

## 2023-11-06 DIAGNOSIS — R040 Epistaxis: Secondary | ICD-10-CM | POA: Diagnosis not present

## 2023-11-06 DIAGNOSIS — R519 Headache, unspecified: Secondary | ICD-10-CM | POA: Diagnosis not present

## 2023-11-15 ENCOUNTER — Inpatient Hospital Stay: Admitting: Family Medicine

## 2023-11-21 ENCOUNTER — Ambulatory Visit: Admitting: Family Medicine

## 2023-11-21 ENCOUNTER — Encounter: Payer: Self-pay | Admitting: Family Medicine

## 2023-11-21 VITALS — BP 122/76 | HR 80 | Temp 97.6°F | Ht 66.0 in | Wt 197.1 lb

## 2023-11-21 DIAGNOSIS — Z23 Encounter for immunization: Secondary | ICD-10-CM

## 2023-11-21 DIAGNOSIS — R040 Epistaxis: Secondary | ICD-10-CM

## 2023-11-21 DIAGNOSIS — N390 Urinary tract infection, site not specified: Secondary | ICD-10-CM | POA: Diagnosis not present

## 2023-11-21 DIAGNOSIS — R10A1 Flank pain, right side: Secondary | ICD-10-CM | POA: Diagnosis not present

## 2023-11-21 LAB — POC URINALSYSI DIPSTICK (AUTOMATED)
Bilirubin, UA: NEGATIVE
Blood, UA: NEGATIVE
Glucose, UA: NEGATIVE
Ketones, UA: NEGATIVE
Leukocytes, UA: NEGATIVE
Nitrite, UA: NEGATIVE
Protein, UA: NEGATIVE
Spec Grav, UA: 1.01 (ref 1.010–1.025)
Urobilinogen, UA: NEGATIVE U/dL — AB
pH, UA: 7.5 (ref 5.0–8.0)

## 2023-11-21 MED ORDER — CEFDINIR 300 MG PO CAPS: 300.0000 mg | ORAL_CAPSULE | Freq: Two times a day (BID) | ORAL | 0 refills | Status: AC

## 2023-11-21 NOTE — Progress Notes (Signed)
 Subjective:     Patient ID: Sally Haas, female    DOB: July 15, 1995, 28 y.o.   MRN: [id number]  Chief Complaint  Patient presents with   Hospitalization Follow-up    Pt was in hospital due to nose bleed over x2 hours also she states pelvic pain (cramping) dysuria x2 days    Discussed the use of AI scribe software for clinical note transcription with the patient, who gave verbal consent to proceed.  History of Present Illness Sally Haas is a 28 year old female with recurrent epistaxis and thrombocytopenia who presents for follow-up after a recent emergency room visit.  She experienced a severe episode of epistaxis lasting approximately two hours, primarily on the right side, correlating with the onset of her menstrual period. The bleeding was intense, extending down her throat and causing coughing and choking. Previous emergency room evaluation included blood work, but no imaging was performed. She has not consulted an ear, nose, and throat specialist for this issue. She has had weekly nosebleeds for years, but usu resolved within .   She has a history of thrombocytopenia, stable over the past year, with previous evaluations ruling out von Willebrand's disease and lupus. Recent blood tests showed large, young platelets, and she has a history of an enlarged spleen and was told her liver looked 'angry' and that she should fix her diet.  She reports recurrent right-sided abdominal pain, associated with urinary tract infections, described as feeling like 'a rock in my side', accompanied by dysuria. She self-initiated treatment with Macrobid, alleviating the dysuria but not the pain. She has a history of pyelonephritis and has been treated with antibiotics such as Omnicef  in the past. No fever is reported, but there is decreased urinary frequency despite increased fluid intake.  Her menstrual periods are normal, lasting five days with a heavier flow on the second day. She is not  on birth control and has had her tubes tied. She also reports hemorrhoids and discomfort since giving birth to her second daughter, attributed to natural vaginal deliveries without medication due to low platelet counts.    Health Maintenance Due  Topic Date Due   Cervical Cancer Screening (Pap smear)  07/24/2022    Past Medical History:  Diagnosis Date   Bipolar disease, chronic (HCC)    Ectopic pregnancy    Kidney stone    Thrombocytopenia     Past Surgical History:  Procedure Laterality Date   DIAGNOSTIC LAPAROSCOPY WITH REMOVAL OF ECTOPIC PREGNANCY Right 05/07/2019   Procedure: Laparoscopic Removal Of Ectopic Pregnancy;  Surgeon: Fredirick Glenys RAMAN, MD;  Location: [facility] OR;  Service: Gynecology;  Laterality: Right;   LAPAROSCOPIC CHOLECYSTECTOMY     TUBAL LIGATION  03/2020   UPPER GASTROINTESTINAL ENDOSCOPY       Current Outpatient Medications:    cefdinir  (OMNICEF ) 300 MG capsule, Take 1 capsule (300 mg total) by mouth 2 (two) times daily., Disp: 14 capsule, Rfl: 0  No Known Allergies ROS neg/noncontributory except as noted HPI/below      Objective:     BP 122/76 (BP Location: Left Arm, Patient Position: Sitting, Cuff Size: Normal)   Pulse 80   Temp 97.6 F (36.4 C) (Temporal)   Ht 5' 6 (1.676 m)   Wt 197 lb 2 oz (89.4 kg)   LMP 10/07/2023 (Approximate)   SpO2 98%   BMI 31.82 kg/m  Wt Readings from Last 3 Encounters:  11/21/23 197 lb 2 oz (89.4 kg)  10/27/23 190 lb (86.2 kg)  09/26/23 196 lb 6 oz (89.1 kg)    Physical Exam GENERAL: Well developed, well nourished, no acute distress. HEAD EYES EARS NOSE THROAT: Normocephalic, atraumatic, conjunctiva not injected, sclera nonicteric, nose - not well visualized. CARDIAC: Regular rate and rhythm, S1 S2 present, no murmur,. NECK: Supple, no thyromegaly, no nodes, no carotid bruits. LUNGS: Clear to auscultation bilaterally, no wheezes. ABDOMEN: Bowel sounds present, soft, right-sided abdominal tenderness,  non-distended, no hepatosplenomegaly, no masses. + R CVAT EXTREMITIES: No edema. MUSCULOSKELETAL: No gross abnormalities. NEUROLOGICAL: Alert and oriented x3, cranial nerves II through XII intact. PSYCHIATRIC: Normal mood, good eye contact.  Results for orders placed or performed in visit on 11/21/23  POCT Urinalysis Dipstick (Automated)   Collection Time: 11/21/23  1:49 PM  Result Value Ref Range   Color, UA yellow    Clarity, UA cloudy    Glucose, UA Negative Negative   Bilirubin, UA neg    Ketones, UA neg    Spec Grav, UA 1.010 1.010 - 1.025   Blood, UA neg    pH, UA 7.5 5.0 - 8.0   Protein, UA Negative Negative   Urobilinogen, UA negative (A) 0.2 or 1.0 E.U./dL   Nitrite, UA neg    Leukocytes, UA Negative Negative         Assessment & Plan:  Epistaxis -     Ambulatory referral to ENT  Frequent UTI -     Ambulatory referral to Urology -     POCT Urinalysis Dipstick (Automated) -     Urine Culture  Right flank pain -     Ambulatory referral to Urology -     POCT Urinalysis Dipstick (Automated) -     Urine Culture  Encounter for immunization -     Flu vaccine trivalent PF, 6mos and older(Flulaval,Afluria,Fluarix,Fluzone)  Other orders -     Cefdinir ; Take 1 capsule (300 mg total) by mouth 2 (two) times daily.  Dispense: 14 capsule; Refill: 0    Assessment and Plan Assessment & Plan Recurrent right-sided epistaxis   She experiences recurrent right-sided epistaxis at least once a week, with severe episodes linked to her menstrual cycle. A previous severe episode required an ER visit and lasted two hours. Differential diagnosis includes endometriosis in the nasal cavity or nasal polyps, or other. She has not had an ENT evaluation. Referred to ENT for nasal cavity evaluation. Advised regular use of nasal saline to prevent dryness and irritation. Recommended Afrin for acute bleeding episodes.  Recurrent urinary tract infection with right-sided flank pain   She has  recurrent UTIs with right-sided flank pain, occurring multiple times over the past year. The current episode includes burning urination and right-sided abdominal pain. A previous CT scan suggested potential pyelonephritis. Current treatment with Macrobid has reduced burning but not resolved flank pain. Differential includes resistant bacteria or anatomical issues. Discontinued Macrobid and started Omnicef  for the current UTI. Ordered urinalysis to assess current infection status-neg-may be from being partially treated. Referred to urology for further evaluation of recurrent UTIs.  Thrombocytopenia   She has chronic thrombocytopenia. Previous evaluations ruled out autoimmune diseases, von Willebrand's disease, and lupus. A recent ER visit showed larger, possibly younger, platelets. No acute concerns from hematology.  Hemorrhoids   She has chronic hemorrhoids, possibly worsened by prolonged standing and previous childbirth, with symptoms of discomfort and potential skin changes. Advised follow-up with OB/GYN for evaluation of hemorrhoids and abdominal discomfort.  Encounter for immunization   Discussed and administered flu shot.  Return if symptoms worsen or fail to improve.  Jenkins CHRISTELLA Carrel, MD

## 2023-11-21 NOTE — Patient Instructions (Addendum)
 Refer ENT  Nasal saline regularly  Afrin if bleeding  [facility] [address] [place], [place], [place] [address] [phone]

## 2023-11-22 LAB — URINE CULTURE
MICRO NUMBER:: 17182094
SPECIMEN QUALITY:: ADEQUATE

## 2023-11-23 ENCOUNTER — Ambulatory Visit: Payer: Self-pay | Admitting: Family Medicine

## 2023-11-23 NOTE — Progress Notes (Signed)
 Called pt she said she will finish antibiotic and if no better will call us  back smk

## 2023-11-23 NOTE — Progress Notes (Signed)
 Urine negative.  Is she feeling better?  If so, finish meds

## 2023-12-06 ENCOUNTER — Other Ambulatory Visit (HOSPITAL_COMMUNITY)

## 2023-12-13 ENCOUNTER — Ambulatory Visit (INDEPENDENT_AMBULATORY_CARE_PROVIDER_SITE_OTHER): Admitting: Physician Assistant

## 2023-12-13 ENCOUNTER — Encounter (INDEPENDENT_AMBULATORY_CARE_PROVIDER_SITE_OTHER): Payer: Self-pay | Admitting: Physician Assistant

## 2023-12-13 VITALS — BP 126/71 | HR 82 | Temp 98.5°F | Ht 66.0 in | Wt 190.0 lb

## 2023-12-13 DIAGNOSIS — R0981 Nasal congestion: Secondary | ICD-10-CM

## 2023-12-13 DIAGNOSIS — R040 Epistaxis: Secondary | ICD-10-CM

## 2023-12-13 DIAGNOSIS — J343 Hypertrophy of nasal turbinates: Secondary | ICD-10-CM

## 2023-12-13 NOTE — Progress Notes (Signed)
 Dear Dr. Wendolyn, Here is my assessment for our mutual patient, Sally Haas. Thank you for allowing me the opportunity to care for your patient. Please do not hesitate to contact me should you have any other questions. Sincerely, Chyrl Cohen PA-C  Otolaryngology Clinic Note Referring provider: Dr. Wendolyn HPI:  Sally Haas is a 28 y.o. female kindly referred by Dr. Wendolyn   Discussed the use of AI scribe software for clinical note transcription with the patient, who gave verbal consent to proceed.  History of Present Illness   Sally Haas is a 28 year old female who presents with recurrent severe right-sided epistaxis.  She experiences recurrent severe epistaxis primarily on the right side, with the most recent significant episode occurring in October, necessitating an emergency department visit. These episodes last over two hours, are extremely heavy, and cause her to choke on blood. She attempts to manage them by leaning forward, applying pressure, and sucking on ice, but these measures have been ineffective in stopping the bleeding. The epistaxis is more intense and prolonged compared to those she experienced in her youth.  She has a history of low platelet counts, consistently around 125, which have been monitored by a hematologist. She is not on any blood thinners or supplements.  She feels more congested on the right side of her nose, affecting her breathing, especially during physical activities. She describes herself as a 'mouth breather' due to a sensation of 'cotton' in her nose. Her sense of smell has slightly diminished over the past year, which she questions might be due to constant exposure to certain smells at work or frequent colds, especially since her daughter started school and often brings home illnesses. No history of trauma to the nose, digital trauma, or allergies.         Independent Review of Additional Tests or Records:   none   PMH/Meds/All/SocHx/FamHx/ROS:   Past Medical History:  Diagnosis Date   Bipolar disease, chronic (HCC)    Ectopic pregnancy    Kidney stone    Thrombocytopenia      Past Surgical History:  Procedure Laterality Date   DIAGNOSTIC LAPAROSCOPY WITH REMOVAL OF ECTOPIC PREGNANCY Right 05/07/2019   Procedure: Laparoscopic Removal Of Ectopic Pregnancy;  Surgeon: Fredirick Glenys RAMAN, MD;  Location: [facility] OR;  Service: Gynecology;  Laterality: Right;   LAPAROSCOPIC CHOLECYSTECTOMY     TUBAL LIGATION  03/2020   UPPER GASTROINTESTINAL ENDOSCOPY      Family History  Problem Relation Age of Onset   Cancer Mother        skin   Alcohol abuse Father    Diabetes Paternal Aunt    Cancer Maternal Grandmother 25       breast   Cancer Paternal Grandmother        lung   Colon cancer Paternal Grandfather    Cancer Paternal Grandfather 71 - 8       colon   Cancer Other        breast   Stomach cancer Neg Hx    Esophageal cancer Neg Hx      Social Connections: Moderately Isolated (09/25/2023)   Social Connection and Isolation Panel    Frequency of Communication with Friends and Family: More than three times a week    Frequency of Social Gatherings with Friends and Family: More than three times a week    Attends Religious Services: Never    Database Administrator or Organizations: No    Attends Banker Meetings:  Not on file    Marital Status: Married      Current Outpatient Medications:    cefdinir  (OMNICEF ) 300 MG capsule, Take 1 capsule (300 mg total) by mouth 2 (two) times daily. (Patient not taking: Reported on 12/13/2023), Disp: 14 capsule, Rfl: 0   Physical Exam:   BP 126/71   Pulse 82   Temp 98.5 F (36.9 C)   Ht 5' 6 (1.676 m)   Wt 190 lb (86.2 kg)   SpO2 96%   BMI 30.67 kg/m   Pertinent Findings  CN II-XII grossly intact Bilateral EAC clear and TM intact with well pneumatized middle ear spaces Anterior rhinoscopy: Septum midline; right inferior and  middle turbinate with moderate hypertrophy, left with mild hypertrophy, no purulence-bilateral nasal septum with superficial vessels, some area of irritation along the right anterior septum, no obvious source of bleeding No lesions of oral cavity/oropharynx; dentition within normal limits No obviously palpable neck masses/lymphadenopathy/thyromegaly No respiratory distress or stridor       Seprately Identifiable Procedures:  PROCEDURE: Bilateral Diagnostic Nasal Endoscopy Pre-procedure diagnosis: Concern for epistaxis Post-procedure diagnosis: same Indication: See pre-procedure diagnosis and physical exam above Complications: None apparent EBL: non mL Anesthesia: Lidocaine  4% and topical decongestant was topically sprayed in each nasal cavity  Description of Procedure:  Patient was identified. A rigid 30 degree endoscope was utilized to evaluate the sinonasal cavities, mucosa, sinus ostia and turbinates and septum.  Overall, signs of mucosal inflammation are noted.  Also noted are moderate hypertrophy of right middle and inferior turbinates.  No mucopurulence, polyps, or masses noted.   Right Middle meatus: Clear Right SE Recess: clear Left MM: clear Left SE Recess: clear Photodocumentation was obtained.  CPT CODE -- 68768 - Mod 25   Impression & Plans:  Sally Haas is a 28 y.o. female with the following   Assessment and Plan    Recurrent right-sided epistaxis Recurrent severe right-sided epistaxis with superficial blood vessels and possible irritation. Low platelet count not sufficient to explain bleeding. Differential includes anterior and posterior bleeding sources. - Nasal endoscopy significant for some turbinate hypertrophy, no lesions or obvious source of bleeding - initiate saline nasal sprays, Vaseline application, and humidifier use. - Avoid nasal sprays like Flonase  - Schedule follow-up in three months   Chronic right nasal congestion Chronic right-sided congestion  with possible irritation of superficial blood vessels. No significant anatomical abnormalities noted. - Use saline nasal sprays ; avoid Flonase until epistaxis is well-controlled - Utilize a humidifier to add moisture to the nasal passages.           - f/u 3-month follow-up   Thank you for allowing me the opportunity to care for your patient. Please do not hesitate to contact me should you have any other questions.  Sincerely, Chyrl Cohen PA-C [facility] Phone: [phone] Fax: [phone]  12/13/2023, 3:27 PM

## 2024-01-30 ENCOUNTER — Encounter: Admitting: Family Medicine

## 2024-03-13 ENCOUNTER — Ambulatory Visit (INDEPENDENT_AMBULATORY_CARE_PROVIDER_SITE_OTHER): Admitting: Physician Assistant
